# Patient Record
Sex: Male | Born: 1945 | Race: White | Hispanic: No | Marital: Married | Smoking: Former smoker
Health system: Southern US, Community
[De-identification: ages and names within clinical notes are randomized; demographics above are authoritative.]

## PROBLEM LIST (undated history)

## (undated) DIAGNOSIS — N189 Chronic kidney disease, unspecified: Secondary | ICD-10-CM

## (undated) DIAGNOSIS — M1990 Unspecified osteoarthritis, unspecified site: Secondary | ICD-10-CM

## (undated) DIAGNOSIS — C221 Intrahepatic bile duct carcinoma: Secondary | ICD-10-CM

## (undated) DIAGNOSIS — N200 Calculus of kidney: Secondary | ICD-10-CM

## (undated) DIAGNOSIS — C3490 Malignant neoplasm of unspecified part of unspecified bronchus or lung: Secondary | ICD-10-CM

## (undated) DIAGNOSIS — I10 Essential (primary) hypertension: Secondary | ICD-10-CM

## (undated) HISTORY — DX: Malignant neoplasm of unspecified part of unspecified bronchus or lung: C34.90

## (undated) HISTORY — DX: Intrahepatic bile duct carcinoma: C22.1

## (undated) HISTORY — PX: OTHER SURGICAL HISTORY: SHX169

## (undated) HISTORY — PX: CHOLECYSTECTOMY: SHX55

## (undated) HISTORY — PX: CYSTECTOMY: SUR359

## (undated) HISTORY — PX: TONSILLECTOMY: SUR1361

---

## 2008-07-09 ENCOUNTER — Encounter (INDEPENDENT_AMBULATORY_CARE_PROVIDER_SITE_OTHER): Payer: Self-pay | Admitting: General Surgery

## 2008-07-09 ENCOUNTER — Ambulatory Visit (HOSPITAL_COMMUNITY): Admission: RE | Admit: 2008-07-09 | Discharge: 2008-07-09 | Payer: Self-pay | Admitting: General Surgery

## 2010-07-31 LAB — BASIC METABOLIC PANEL
Chloride: 105 mEq/L (ref 96–112)
Creatinine, Ser: 0.94 mg/dL (ref 0.4–1.5)
GFR calc Af Amer: >60 mL/min (ref >60)
Sodium: 138 mEq/L (ref 135–145)

## 2010-07-31 LAB — CBC
Hemoglobin: 16.1 g/dL (ref 13.0–17.0)
MCV: 89.5 fL (ref 78.0–100.0)
RBC: 5.14 MIL/uL (ref 4.22–5.81)
WBC: 7.2 10*3/uL (ref 4.0–10.5)

## 2010-07-31 LAB — GLUCOSE, CAPILLARY: Glucose-Capillary: 183 mg/dL — ABNORMAL HIGH (ref 70–99)

## 2010-09-02 NOTE — H&P (Signed)
Bryce Daugherty, Bryce Daugherty                 ACCOUNT NO.:  [id number]   MEDICAL RECORD NO.:  [id number]          PATIENT TYPE:  AMB   LOCATION:  DAY                           FACILITY:  [facility]   PHYSICIAN:  Dalia Heading, M.D.  DATE OF BIRTH:  [birth year]   DATE OF ADMISSION:  DATE OF DISCHARGE:  LH                              HISTORY & PHYSICAL   CHIEF COMPLAINT:  Sebaceous cyst, left axilla.   HISTORY OF PRESENT ILLNESS:  The patient is a 65 year old white male who  is referred for evaluation and treatment of a left axillary sebaceous  cyst.  It has been present for sometime, but recently was increased in  size and is tender to touch.   PAST MEDICAL HISTORY:  Hypertension.   PAST SURGICAL HISTORY:  Cholecystectomy.   CURRENT MEDICATIONS:  1. Atenolol 25 mg p.o. daily.  2. Doxazosin 4 mg p.o. daily.   ALLERGIES:  No known drug allergies.   REVIEW OF SYSTEMS:  Noncontributory.   PHYSICAL EXAMINATION:  GENERAL:  The patient is well-developed, well-  nourished white male in no acute distress.  LUNGS:  Clear to auscultation with equal breath sounds bilaterally.  HEART:  Regular rate and rhythm without S3, S4, or murmurs.  The left  axilla has a 3-cm sebaceous cyst with sebum starting to be expressed.  It is slightly erythematous along the lateral aspect.   IMPRESSION:  Sebaceous cyst, left axilla.   PLAN:  The patient is scheduled for excision of sebaceous cyst, left  axilla on July 09, 2008.  The risks and benefits of the procedure  including bleeding, infection, and recurrence of the cyst were fully  explained to the patient, gave informed consent.      Dalia Heading, M.D.  Electronically Signed     MAJ/MEDQ  D:  07/05/2008  T:  07/06/2008  Job:  [id number]   cc:   Ramon Dredge L. Juanetta Gosling, M.D.  Fax: [phone]

## 2010-09-02 NOTE — Op Note (Signed)
NAMEWESTEN, DININO                 ACCOUNT NO.:  [id number]   MEDICAL RECORD NO.:  [id number]          PATIENT TYPE:  AMB   LOCATION:  DAY                           FACILITY:  [facility]   PHYSICIAN:  Dalia Heading, M.D.  DATE OF BIRTH:  05/04/1945   DATE OF PROCEDURE:  DATE OF DISCHARGE:                               OPERATIVE REPORT   DATE OF PROCEDURE:  July 09, 2008.   PREOPERATIVE DIAGNOSIS:  Sebaceous cyst, left axilla, 3 cm in size.   POSTOPERATIVE DIAGNOSIS:  Sebaceous cyst, left axilla, 3 cm in size.   PROCEDURE:  Excision of sebaceous cyst, left axilla.   SURGEON:  Dalia Heading, M.D.   ANESTHESIA:  General.   INDICATIONS:  The patient is a 66 year old white male presents with an  enlarging and tender sebaceous cyst in his left axilla.  The risks and  benefits of the procedure including bleeding, infection, recurrence of  the cyst were fully explained to the patient, I gave informed consent.   PROCEDURE NOTE:  The patient was placed in supine position.  After  general anesthesia was administered, the left axilla was prepped and  draped using the usual sterile technique with DuraPrep.  Surgical site  confirmation was performed.   An incision was made over the sebaceous cyst.  The sebaceous cyst was  excised without difficulty.  It was sent to Pathology for further  examination.  Bleeding was controlled using Bovie electrocautery.  The  incision site was instilled with 0.57 Sensorcaine.  The skin was closed  using a 4-0 Vicryl subcuticular suture.  Dermabond was then applied.   All tape and needle counts correct at the end of the procedure.  The  patient was awakened and transferred to PACU in stable condition.   COMPLICATIONSS:  None.   SPECIMEN:  Sebaceous cyst, left axilla.   BLOOD LOSS:  Minimal.      Dalia Heading, M.D.  Electronically Signed     MAJ/MEDQ  D:  07/09/2008  T:  07/09/2008  Job:  [id number]   cc:   Ramon Dredge L. Juanetta Gosling, M.D.  Fax:  [phone]

## 2011-02-25 ENCOUNTER — Telehealth: Payer: Self-pay

## 2011-02-25 ENCOUNTER — Other Ambulatory Visit: Payer: Self-pay

## 2011-02-25 DIAGNOSIS — Z139 Encounter for screening, unspecified: Secondary | ICD-10-CM

## 2011-02-25 NOTE — Telephone Encounter (Signed)
MOVIPREP SPLIT DOSING HOLD GLIPIZIDE THE AM OF HIS PROCEDURE. TAKE 1/2 HIS TENORMIN THE AM OF HIS PROCEDURE.

## 2011-02-25 NOTE — Telephone Encounter (Signed)
Gastroenterology Pre-Procedure Form    Request Date: 02/25/2011      Requesting Physician: Dr. Juanetta Gosling     PATIENT INFORMATION:  Bryce Daugherty is a 65 y.o., male (DOB=1945/10/14).  PROCEDURE: Procedure(s) requested: colonoscopy Procedure Reason: screening for colon cancer  PATIENT REVIEW QUESTIONS: The patient reports the following:   1. Diabetes Melitis: yes  2. Joint replacements in the past 12 months: no 3. Major health problems in the past 3 months: no 4. Has an artificial valve or MVP:no 5. Has been advised in past to take antibiotics in advance of a procedure like teeth cleaning: no}    MEDICATIONS & ALLERGIES:    Patient reports the following regarding taking any blood thinners:   Plavix? no Aspirin?yes  Coumadin?  no  Patient confirms/reports the following medications:  Current Outpatient Prescriptions  Medication Sig Dispense Refill  . aspirin 81 MG tablet Take 81 mg by mouth daily.        Marland Kitchen atenolol (TENORMIN) 25 MG tablet Take 25 mg by mouth daily.        Marland Kitchen doxazosin (CARDURA) 8 MG tablet Take 8 mg by mouth at bedtime. Takes one half tablet daily       . glipiZIDE (GLUCOTROL) 5 MG tablet Take 5 mg by mouth 1 day or 1 dose. Pt takes in the AM       . metformin (FORTAMET) 500 MG (OSM) 24 hr tablet Take 500 mg by mouth daily with breakfast.          Patient confirms/reports the following allergies:  No Known Allergies  Patient is appropriate to schedule for requested procedure(s): yes  AUTHORIZATION INFORMATION Primary Insurance:   ID #:   Group #:  Pre-Cert / Auth required:  Pre-Cert / Auth #:   Secondary Insurance:   ID #:  Group #:  Pre-Cert / Auth required:  Pre-Cert / Auth #:   No orders of the defined types were placed in this encounter.    SCHEDULE INFORMATION: Procedure has been scheduled as follows:  Date: 03/24/2011     Time: 8:30 Am  Location: [facility] Short Stay  This Gastroenterology Pre-Precedure Form is being routed to the  following provider(s) for review: Jonette Eva, MD

## 2011-02-26 NOTE — Telephone Encounter (Signed)
Rx and instructions mailed.  

## 2011-03-17 ENCOUNTER — Encounter (HOSPITAL_COMMUNITY): Payer: Self-pay | Admitting: Pharmacy Technician

## 2011-03-23 MED ORDER — SODIUM CHLORIDE 0.45 % IV SOLN
Freq: Once | INTRAVENOUS | Status: AC
  Administered 2011-03-24: 08:00:00 via INTRAVENOUS

## 2011-03-24 ENCOUNTER — Ambulatory Visit (HOSPITAL_COMMUNITY)
Admission: RE | Admit: 2011-03-24 | Discharge: 2011-03-24 | Disposition: A | Discharge status: Home / Self Care | Payer: 59 | Source: Ambulatory Visit | Attending: Gastroenterology | Admitting: Gastroenterology

## 2011-03-24 ENCOUNTER — Encounter (HOSPITAL_COMMUNITY)
Admission: RE | Disposition: A | Discharge status: Home / Self Care | Payer: Self-pay | Source: Ambulatory Visit | Attending: Gastroenterology

## 2011-03-24 ENCOUNTER — Encounter (HOSPITAL_COMMUNITY): Payer: Self-pay | Admitting: *Deleted

## 2011-03-24 DIAGNOSIS — Z79899 Other long term (current) drug therapy: Secondary | ICD-10-CM | POA: Insufficient documentation

## 2011-03-24 DIAGNOSIS — Z1211 Encounter for screening for malignant neoplasm of colon: Secondary | ICD-10-CM

## 2011-03-24 DIAGNOSIS — I10 Essential (primary) hypertension: Secondary | ICD-10-CM | POA: Insufficient documentation

## 2011-03-24 DIAGNOSIS — Z139 Encounter for screening, unspecified: Secondary | ICD-10-CM

## 2011-03-24 DIAGNOSIS — Z01812 Encounter for preprocedural laboratory examination: Secondary | ICD-10-CM | POA: Insufficient documentation

## 2011-03-24 DIAGNOSIS — K648 Other hemorrhoids: Secondary | ICD-10-CM | POA: Insufficient documentation

## 2011-03-24 DIAGNOSIS — Z7982 Long term (current) use of aspirin: Secondary | ICD-10-CM | POA: Insufficient documentation

## 2011-03-24 DIAGNOSIS — E119 Type 2 diabetes mellitus without complications: Secondary | ICD-10-CM | POA: Insufficient documentation

## 2011-03-24 HISTORY — DX: Essential (primary) hypertension: I10

## 2011-03-24 HISTORY — PX: COLONOSCOPY: SHX5424

## 2011-03-24 LAB — GLUCOSE, CAPILLARY: Glucose-Capillary: 141 mg/dL — ABNORMAL HIGH (ref 70–99)

## 2011-03-24 SURGERY — COLONOSCOPY
Anesthesia: Moderate Sedation

## 2011-03-24 MED ORDER — STERILE WATER FOR IRRIGATION IR SOLN
Status: DC | PRN
  Administered 2011-03-24: 09:00:00

## 2011-03-24 MED ORDER — MIDAZOLAM HCL 5 MG/5ML IJ SOLN
INTRAMUSCULAR | Status: AC
  Filled 2011-03-24: qty 10

## 2011-03-24 MED ORDER — MIDAZOLAM HCL 5 MG/5ML IJ SOLN
INTRAMUSCULAR | Status: DC | PRN
  Administered 2011-03-24: 1 mg via INTRAVENOUS
  Administered 2011-03-24: 2 mg via INTRAVENOUS

## 2011-03-24 MED ORDER — MEPERIDINE HCL 100 MG/ML IJ SOLN
INTRAMUSCULAR | Status: AC
  Filled 2011-03-24: qty 2

## 2011-03-24 MED ORDER — MEPERIDINE HCL 100 MG/ML IJ SOLN
INTRAMUSCULAR | Status: DC | PRN
  Administered 2011-03-24: 25 mg via INTRAVENOUS
  Administered 2011-03-24: 50 mg via INTRAVENOUS

## 2011-03-24 NOTE — H&P (Signed)
  Primary Care Physician:  Fredirick Maudlin, MD Primary Gastroenterologist:  Dr. Darrick Penna  Pre-Procedure History & Physical: HPI:  Bryce Daugherty is a 65 y.o. male here for AVERAGE RISK SCREENING.  Past Medical History  Diagnosis Date  . Hypertension   . Diabetes mellitus     Past Surgical History  Procedure Date  . Cystectomy under left arm    Prior to Admission medications   Medication Sig Start Date End Date Taking? Authorizing Provider  aspirin EC 81 MG tablet Take 81 mg by mouth daily.     Yes Historical Provider, MD  atenolol (TENORMIN) 25 MG tablet Take 25 mg by mouth daily.     Yes Historical Provider, MD  doxazosin (CARDURA) 8 MG tablet Take 8 mg by mouth at bedtime. Takes one half tablet daily    Yes Historical Provider, MD  glipiZIDE (GLUCOTROL) 5 MG tablet Take 5 mg by mouth every morning. Pt takes in the AM   Yes Historical Provider, MD  metformin (FORTAMET) 500 MG (OSM) 24 hr tablet Take 500 mg by mouth daily with breakfast.    Yes Historical Provider, MD  Saw Palmetto, Serenoa repens, 450 MG CAPS Take 1 capsule by mouth daily.     Yes Historical Provider, MD    Allergies as of 02/25/2011  . (No Known Allergies)    History reviewed. No pertinent family history.  History   Social History  . Marital Status: Married    Spouse Name: N/A    Number of Children: N/A  . Years of Education: N/A   Occupational History  . Not on file.   Social History Main Topics  . Smoking status: Former Games developer  . Smokeless tobacco: Not on file  . Alcohol Use: No  . Drug Use: No  . Sexually Active:    Other Topics Concern  . Not on file   Social History Narrative  . No narrative on file    Review of Systems: See HPI, otherwise negative ROS   Physical Exam: BP 134/84  Pulse 54  Temp(Src) 97.8 F (36.6 C) (Oral)  Resp 14  SpO2 96% General:   Alert,  pleasant and cooperative in NAD Head:  Normocephalic and atraumatic. Neck:  Supple; no masses or  thyromegaly. Lungs:  Clear throughout to auscultation.    Heart:  Regular rate and rhythm. Abdomen:  Soft, nontender and nondistended. Normal bowel sounds, without guarding, and without rebound.   Neurologic:  Alert and  oriented x4;  grossly normal neurologically.  Impression/Plan:    AVERAGE RISK  PLAN: TCS TODAY

## 2011-04-02 ENCOUNTER — Encounter (HOSPITAL_COMMUNITY): Payer: Self-pay | Admitting: Gastroenterology

## 2011-04-20 ENCOUNTER — Other Ambulatory Visit (HOSPITAL_COMMUNITY): Payer: Self-pay | Admitting: Pulmonary Disease

## 2011-04-20 ENCOUNTER — Ambulatory Visit (HOSPITAL_COMMUNITY)
Admission: RE | Admit: 2011-04-20 | Discharge: 2011-04-20 | Disposition: A | Discharge status: Home / Self Care | Payer: 59 | Source: Ambulatory Visit | Attending: Pulmonary Disease | Admitting: Pulmonary Disease

## 2011-04-20 DIAGNOSIS — K7689 Other specified diseases of liver: Secondary | ICD-10-CM | POA: Insufficient documentation

## 2011-04-20 DIAGNOSIS — N201 Calculus of ureter: Secondary | ICD-10-CM | POA: Insufficient documentation

## 2011-04-20 DIAGNOSIS — N200 Calculus of kidney: Secondary | ICD-10-CM | POA: Insufficient documentation

## 2011-04-20 DIAGNOSIS — R1032 Left lower quadrant pain: Secondary | ICD-10-CM

## 2011-04-20 LAB — CREATININE, SERUM
Creatinine, Ser: 1.4 mg/dL — ABNORMAL HIGH (ref 0.50–1.35)
GFR calc non Af Amer: 51 mL/min — ABNORMAL LOW (ref >90)

## 2011-04-20 MED ORDER — IOHEXOL 300 MG/ML  SOLN
100.0000 mL | Freq: Once | INTRAMUSCULAR | Status: AC | PRN
  Administered 2011-04-20: 100 mL via INTRAVENOUS

## 2011-04-24 ENCOUNTER — Other Ambulatory Visit: Payer: Self-pay | Admitting: Urology

## 2011-04-24 ENCOUNTER — Ambulatory Visit (HOSPITAL_COMMUNITY)
Admission: RE | Admit: 2011-04-24 | Discharge: 2011-04-24 | Disposition: A | Discharge status: Home / Self Care | Payer: 59 | Source: Ambulatory Visit | Attending: Urology | Admitting: Urology

## 2011-04-24 ENCOUNTER — Ambulatory Visit (INDEPENDENT_AMBULATORY_CARE_PROVIDER_SITE_OTHER): Payer: 59 | Admitting: Urology

## 2011-04-24 ENCOUNTER — Encounter (HOSPITAL_COMMUNITY): Payer: Self-pay | Admitting: *Deleted

## 2011-04-24 DIAGNOSIS — N201 Calculus of ureter: Secondary | ICD-10-CM

## 2011-04-24 DIAGNOSIS — N200 Calculus of kidney: Secondary | ICD-10-CM

## 2011-04-24 DIAGNOSIS — R109 Unspecified abdominal pain: Secondary | ICD-10-CM | POA: Insufficient documentation

## 2011-04-24 NOTE — Progress Notes (Signed)
Instructed to hold all aspirin, ibuprofen, vitamins, herbal medication till after ESWL.Review list in blue folder of all restricted meds. Laxative as directed by doctor day prior. Hold diabetes medication day of procedure. Clear liquids from midnight till 11am . Plenty of liquids due to diabetes.

## 2011-04-24 NOTE — Progress Notes (Signed)
1815 spoke to patient  Informed to arrive at 1400 (not 1500) and to be npo after 1000 (instead of 1100 as previously instructed by nurse)  Pt verbalizes understanding of this,

## 2011-04-27 ENCOUNTER — Encounter (HOSPITAL_COMMUNITY)
Admission: RE | Disposition: A | Discharge status: Home / Self Care | Payer: Self-pay | Source: Ambulatory Visit | Attending: Urology

## 2011-04-27 ENCOUNTER — Ambulatory Visit (HOSPITAL_COMMUNITY): Payer: 59

## 2011-04-27 ENCOUNTER — Ambulatory Visit (HOSPITAL_COMMUNITY)
Admission: RE | Admit: 2011-04-27 | Discharge: 2011-04-27 | Disposition: A | Discharge status: Home / Self Care | Payer: 59 | Source: Ambulatory Visit | Attending: Urology | Admitting: Urology

## 2011-04-27 ENCOUNTER — Encounter (HOSPITAL_COMMUNITY): Payer: Self-pay | Admitting: *Deleted

## 2011-04-27 DIAGNOSIS — N201 Calculus of ureter: Secondary | ICD-10-CM | POA: Diagnosis present

## 2011-04-27 DIAGNOSIS — E119 Type 2 diabetes mellitus without complications: Secondary | ICD-10-CM | POA: Insufficient documentation

## 2011-04-27 DIAGNOSIS — I10 Essential (primary) hypertension: Secondary | ICD-10-CM | POA: Insufficient documentation

## 2011-04-27 HISTORY — DX: Chronic kidney disease, unspecified: N18.9

## 2011-04-27 HISTORY — DX: Calculus of kidney: N20.0

## 2011-04-27 HISTORY — DX: Unspecified osteoarthritis, unspecified site: M19.90

## 2011-04-27 SURGERY — LITHOTRIPSY, ESWL
Anesthesia: LOCAL | Site: Pelvis | Laterality: Left

## 2011-04-27 MED ORDER — DIPHENHYDRAMINE HCL 25 MG PO CAPS
25.0000 mg | ORAL_CAPSULE | ORAL | Status: AC
  Administered 2011-04-27: 25 mg via ORAL

## 2011-04-27 MED ORDER — DIPHENHYDRAMINE HCL 25 MG PO CAPS
ORAL_CAPSULE | ORAL | Status: AC
  Administered 2011-04-27: 25 mg via ORAL
  Filled 2011-04-27: qty 1

## 2011-04-27 MED ORDER — DIAZEPAM 5 MG PO TABS
10.0000 mg | ORAL_TABLET | ORAL | Status: AC
  Administered 2011-04-27: 10 mg via ORAL

## 2011-04-27 MED ORDER — CIPROFLOXACIN HCL 500 MG PO TABS
500.0000 mg | ORAL_TABLET | ORAL | Status: AC
  Administered 2011-04-27: 500 mg via ORAL

## 2011-04-27 MED ORDER — DIAZEPAM 5 MG PO TABS
ORAL_TABLET | ORAL | Status: AC
  Administered 2011-04-27: 10 mg via ORAL
  Filled 2011-04-27: qty 2

## 2011-04-27 MED ORDER — DEXTROSE-NACL 5-0.45 % IV SOLN
INTRAVENOUS | Status: DC
  Administered 2011-04-27: 16:00:00 via INTRAVENOUS

## 2011-04-27 MED ORDER — CIPROFLOXACIN HCL 500 MG PO TABS
ORAL_TABLET | ORAL | Status: AC
  Administered 2011-04-27: 500 mg via ORAL
  Filled 2011-04-27: qty 1

## 2011-04-27 NOTE — H&P (Signed)
1. 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 Nephrolithiasis Bilateral 592.0 3. Ureteral Stone Left 592.1 4. Ureteral Stone Right 592.1  History of Present Illness  Bryce Daugherty is a 65 yo WM sent by Dr. Shaune Pollack for a left proximal ureteral stone.  He had the onset in October of right flank pain but the pain eased.  He had a negative colonoscopy in December.  The Sunday before Christmas he had mod/severe left flank pain with nausea with vomiting x 1.  He saw Dr. Juanetta Gosling and was treated with muscle relaxer and steroids.  He returned on Monday and was sent for a CT and was found to have a 9mm left proximal stone.  He was also found to have bilateral renal stones.  He was given pain meds and his pain abated but then returned.  Today he has intermittant pain.  He has no further nausea.  He has no gross hematuria.  He has some frequency with increased fluids but no urgency.  He has had no more right sided pain and had no right ureteral stones on CT.  He has not had prior stones or other GU history.   Past Medical History Problems  1. History of  Arthritis V13.4 2. History of  Cataract 366.9 3. History of  Diabetes Mellitus 250.00 4. History of  Hypertension 401.9  Surgical History Problems  1. History of  Cholecystectomy 2. History of  Tonsillectomy  Current Meds 1. Atenolol 25 MG Oral Tablet; Therapy: (Recorded:04Jan2013) to 2. Doxazosin Mesylate 4 MG Oral Tablet; Therapy: (Recorded:04Jan2013) to 3. GlipiZIDE 5 MG Oral Tablet; Therapy: (Recorded:04Jan2013) to 4. MetFORMIN HCl 500 MG Oral Tablet; Therapy: (Recorded:04Jan2013) to  Allergies Medication  1. No Known Drug Allergies  Family History Problems  1. Family history of  Acute Myocardial Infarction V17.3 2. Family history of  Carcinoma Of The Pancreas 3. Family history of  Death In The Family Father 4. Family history of  Death In The Family Mother 5. Family history of  Family Health Status Number Of Children  Social History Problems     Caffeine Use   Former Smoker V15.82   Marital History - Currently Married   Occupation: Denied    History of  Alcohol Use  Review of Systems Genitourinary, constitutional, skin, eye, otolaryngeal, hematologic/lymphatic, cardiovascular, pulmonary, endocrine, musculoskeletal, gastrointestinal, neurological and psychiatric system(s) were reviewed and pertinent findings if present are noted.  Genitourinary: urinary frequency and nocturia.  Gastrointestinal: flank pain and abdominal pain.  Constitutional: recent weight loss.  Musculoskeletal: back pain.    Vitals Vital Signs [Data Includes: Last 1 Day]  04Jan2013 10:20AM  BMI Calculated: 31.52 BSA Calculated: 2.08 Height: 5 ft 8 in Weight: 208 lb  Blood Pressure: 154 / 92 Temperature: 98.3 F Heart Rate: 102  Physical Exam Constitutional: Well nourished and well developed . No acute distress.  ENT:. The ears and nose are normal in appearance.  Neck: The appearance of the neck is normal and no neck mass is present.  Pulmonary: No respiratory distress and normal respiratory rhythm and effort.  Cardiovascular: Heart rate and rhythm are normal . No peripheral edema.  Abdomen: The abdomen is soft and nontender. No masses are palpated. mild left CVA tenderness. No hernias are palpable. No hepatosplenomegaly noted.  Lymphatics: The supraclavicular and axillary nodes are not enlarged or tender.  Skin: Normal skin turgor, no visible rash and no visible skin lesions.  Neuro/Psych:. Mood and affect are appropriate.    Results/Data Urine [Data Includes: Last 1 Day]  04Jan2013  COLOR YELLOW   APPEARANCE CLOUDY   SPECIFIC GRAVITY 1.025   pH 6.0   GLUCOSE NEG mg/dL  BILIRUBIN NEG   KETONE NEG mg/dL  BLOOD LARGE   PROTEIN TRACE mg/dL  UROBILINOGEN 0.2 mg/dL  NITRITE NEG   LEUKOCYTE ESTERASE NEG   SQUAMOUS EPITHELIAL/HPF RARE   WBC 0-3 WBC/hpf  RBC TNTC RBC/hpf  BACTERIA RARE   CRYSTALS NONE SEEN   CASTS NONE SEEN    Old  records or history reviewed: I have reviewed recent records from Dr. Juanetta Gosling.  The following images/tracing/specimen were independently visualized:  I have reviewed his CT films and report. He has bilateral renal stones and a 9mm left proximal stone. He has a 7cm liver lesion that is probably a hemangioma but an MRI was recommended. KUB today shows no change in the location of the 8x27mm left proximal stone. He has small left renal stones. His RLP stone is now at the RUPJ and measures 11x21mm. He has small right renal stones. See report for additional details. I did a limited bilateral renal US in the office today and he has no hydronephrosis.    Assessment Assessed  1. Ureteral Stone Left 592.1 2. Nephrolithiasis Bilateral 592.0 3. Ureteral Stone Right 592.1 4. 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   Fortunately, he has no obstruction at this time on either side. He has a 7cm liver lesion for which an MRI was suggested.   Plan Nephrolithiasis (592.0)  1. UA With REFLEX  Done: 04Jan2013 10:33AM Ureteral Stone (592.1)  2. Oxycodone-Acetaminophen 5-325 MG Oral Tablet; take 1 or 2 tablets q 4-6 hours prn pain;  Therapy: 04Jan2013 to (Last Rx:04Jan2013) 3. KUB  Requested for: 04Jan2013   I discussed the options for treatment including ureteroscopy and ESWL.   Since he is not obstructed and his pain is intermittant, I believe sequential ESWL is appropriate and will set him up for a left ESWL next week followed by a right ESWL 2 weeks later.  I have reviewed the risks of bleeding, infection, injury to adjacent organs which can possibly catastrophic and life threatening, poor fragmentation or obstructing fragments requiring additional procedures, thrombotic events, cardiac arrhythmias and  sedation risks. He will need an MRI of the abdomen for further evaluation of the liver lesion, but I will defer that to Dr. Juanetta Gosling. He will need evaluation for metabolic stone disease because of the extent of disease.   He was going send some labs that he had earlier this year.

## 2011-04-27 NOTE — Interval H&P Note (Signed)
History and Physical Interval Note:  04/27/2011 7:59 AM  Bryce Daugherty  has presented today for surgery, with the diagnosis of left ureteral stone  The various methods of treatment have been discussed with the patient and family. After consideration of risks, benefits and other options for treatment, the patient has consented to  Procedure(s): EXTRACORPOREAL SHOCK WAVE LITHOTRIPSY (ESWL) as a surgical intervention .  The patients' history has been reviewed, patient examined, no change in status, stable for surgery.  I have reviewed the patients' chart and labs.  Questions were answered to the patient's satisfaction.     [name]

## 2011-04-27 NOTE — Op Note (Signed)
S/P left ESWL.   See scanned [facility] op note.

## 2011-04-29 ENCOUNTER — Encounter (HOSPITAL_COMMUNITY): Payer: Self-pay

## 2011-04-30 ENCOUNTER — Encounter (HOSPITAL_COMMUNITY): Payer: Self-pay | Admitting: *Deleted

## 2011-04-30 NOTE — Progress Notes (Signed)
Patient given telephone instructions for ESWL. To hold aspirin 81 mg (has not taken ASA for one month). No aspirin products or NSAIDS for 72 hours before procedure. To bring blue folder from [facility]. NPO after midnight the night before procedure. To take atenolol and doxazosin the AM of procedure with sip of water. To hold glipizide and metformin. Pt verbalizes understanding.

## 2011-05-04 ENCOUNTER — Other Ambulatory Visit: Payer: Self-pay | Admitting: Urology

## 2011-05-04 ENCOUNTER — Ambulatory Visit (HOSPITAL_COMMUNITY)
Admission: RE | Admit: 2011-05-04 | Discharge: 2011-05-04 | Disposition: A | Discharge status: Home / Self Care | Payer: 59 | Source: Ambulatory Visit | Attending: Urology | Admitting: Urology

## 2011-05-04 DIAGNOSIS — N201 Calculus of ureter: Secondary | ICD-10-CM | POA: Insufficient documentation

## 2011-05-04 DIAGNOSIS — N200 Calculus of kidney: Secondary | ICD-10-CM | POA: Insufficient documentation

## 2011-05-04 DIAGNOSIS — R109 Unspecified abdominal pain: Secondary | ICD-10-CM | POA: Insufficient documentation

## 2011-05-06 NOTE — H&P (View-Only) (Signed)
 1. 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 Nephrolithiasis Bilateral 592.0 3. Ureteral Stone Left 592.1 4. Ureteral Stone Right 592.1  History of Present Illness  Bryce Daugherty is a 66 yo WM sent by Dr. Ed Hawkins for a left proximal ureteral stone.  He had the onset in October of right flank pain but the pain eased.  He had a negative colonoscopy in December.  The Sunday before Christmas he had mod/severe left flank pain with nausea with vomiting x 1.  He saw Dr. Hawkins and was treated with muscle relaxer and steroids.  He returned on Monday and was sent for a CT and was found to have a 9mm left proximal stone.  He was also found to have bilateral renal stones.  He was given pain meds and his pain abated but then returned.  Today he has intermittant pain.  He has no further nausea.  He has no gross hematuria.  He has some frequency with increased fluids but no urgency.  He has had no more right sided pain and had no right ureteral stones on CT.  He has not had prior stones or other GU history.   Past Medical History Problems  1. History of  Arthritis V13.4 2. History of  Cataract 366.9 3. History of  Diabetes Mellitus 250.00 4. History of  Hypertension 401.9  Surgical History Problems  1. History of  Cholecystectomy 2. History of  Tonsillectomy  Current Meds 1. Atenolol 25 MG Oral Tablet; Therapy: (Recorded:04Jan2013) to 2. Doxazosin Mesylate 4 MG Oral Tablet; Therapy: (Recorded:04Jan2013) to 3. GlipiZIDE 5 MG Oral Tablet; Therapy: (Recorded:04Jan2013) to 4. MetFORMIN HCl 500 MG Oral Tablet; Therapy: (Recorded:04Jan2013) to  Allergies Medication  1. No Known Drug Allergies  Family History Problems  1. Family history of  Acute Myocardial Infarction V17.3 2. Family history of  Carcinoma Of The Pancreas 3. Family history of  Death In The Family Father 4. Family history of  Death In The Family Mother 5. Family history of  Family Health Status Number Of Children  Social History Problems     Caffeine Use   Former Smoker V15.82   Marital History - Currently Married   Occupation: Denied    History of  Alcohol Use  Review of Systems Genitourinary, constitutional, skin, eye, otolaryngeal, hematologic/lymphatic, cardiovascular, pulmonary, endocrine, musculoskeletal, gastrointestinal, neurological and psychiatric system(s) were reviewed and pertinent findings if present are noted.  Genitourinary: urinary frequency and nocturia.  Gastrointestinal: flank pain and abdominal pain.  Constitutional: recent weight loss.  Musculoskeletal: back pain.    Vitals Vital Signs [Data Includes: Last 1 Day]  04Jan2013 10:20AM  BMI Calculated: 31.52 BSA Calculated: 2.08 Height: 5 ft 8 in Weight: 208 lb  Blood Pressure: 154 / 92 Temperature: 98.3 F Heart Rate: 102  Physical Exam Constitutional: Well nourished and well developed . No acute distress.  ENT:. The ears and nose are normal in appearance.  Neck: The appearance of the neck is normal and no neck mass is present.  Pulmonary: No respiratory distress and normal respiratory rhythm and effort.  Cardiovascular: Heart rate and rhythm are normal . No peripheral edema.  Abdomen: The abdomen is soft and nontender. No masses are palpated. mild left CVA tenderness. No hernias are palpable. No hepatosplenomegaly noted.  Lymphatics: The supraclavicular and axillary nodes are not enlarged or tender.  Skin: Normal skin turgor, no visible rash and no visible skin lesions.  Neuro/Psych:. Mood and affect are appropriate.    Results/Data Urine [Data Includes: Last 1 Day]     04Jan2013  COLOR YELLOW   APPEARANCE CLOUDY   SPECIFIC GRAVITY 1.025   pH 6.0   GLUCOSE NEG mg/dL  BILIRUBIN NEG   KETONE NEG mg/dL  BLOOD LARGE   PROTEIN TRACE mg/dL  UROBILINOGEN 0.2 mg/dL  NITRITE NEG   LEUKOCYTE ESTERASE NEG   SQUAMOUS EPITHELIAL/HPF RARE   WBC 0-3 WBC/hpf  RBC TNTC RBC/hpf  BACTERIA RARE   CRYSTALS NONE SEEN   CASTS NONE SEEN    Old  records or history reviewed: I have reviewed recent records from Dr. Hawkins.  The following images/tracing/specimen were independently visualized:  I have reviewed his CT films and report. He has bilateral renal stones and a 9mm left proximal stone. He has a 7cm liver lesion that is probably a hemangioma but an MRI was recommended. KUB today shows no change in the location of the 8x5mm left proximal stone. He has small left renal stones. His RLP stone is now at the RUPJ and measures 11x6mm. He has small right renal stones. See report for additional details. I did a limited bilateral renal US in the office today and he has no hydronephrosis.    Assessment Assessed  1. Ureteral Stone Left 592.1 2. Nephrolithiasis Bilateral 592.0 3. Ureteral Stone Right 592.1 4. 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   Fortunately, he has no obstruction at this time on either side. He has a 7cm liver lesion for which an MRI was suggested.   Plan Nephrolithiasis (592.0)  1. UA With REFLEX  Done: 04Jan2013 10:33AM Ureteral Stone (592.1)  2. Oxycodone-Acetaminophen 5-325 MG Oral Tablet; take 1 or 2 tablets q 4-6 hours prn pain;  Therapy: 04Jan2013 to (Last Rx:04Jan2013) 3. KUB  Requested for: 04Jan2013   I discussed the options for treatment including ureteroscopy and ESWL.   Since he is not obstructed and his pain is intermittant, I believe sequential ESWL is appropriate and will set him up for a left ESWL next week followed by a right ESWL 2 weeks later.  I have reviewed the risks of bleeding, infection, injury to adjacent organs which can possibly catastrophic and life threatening, poor fragmentation or obstructing fragments requiring additional procedures, thrombotic events, cardiac arrhythmias and  sedation risks. He will need an MRI of the abdomen for further evaluation of the liver lesion, but I will defer that to Dr. Hawkins. He will need evaluation for metabolic stone disease because of the extent of disease.   He was going send some labs that he had earlier this year.   

## 2011-05-06 NOTE — Interval H&P Note (Signed)
History and Physical Interval Note:  05/06/2011 8:58 AM  Bryce Daugherty  has presented today for surgery, with the diagnosis of right ureteral stone  The various methods of treatment have been discussed with the patient and family. After consideration of risks, benefits and other options for treatment, the patient has consented to  Procedure(s): EXTRACORPOREAL SHOCK WAVE LITHOTRIPSY (ESWL) as a surgical intervention .  The patients' history has been reviewed, patient examined, no change in status, stable for surgery.  I have reviewed the patients' chart and labs.  Questions were answered to the patient's satisfaction.     [name]  He has almost cleared the left ureteral stone and is to have treatment of the right renal stone on 1/17.

## 2011-05-07 ENCOUNTER — Ambulatory Visit (HOSPITAL_COMMUNITY): Payer: 59

## 2011-05-07 ENCOUNTER — Encounter (HOSPITAL_COMMUNITY): Payer: Self-pay | Admitting: *Deleted

## 2011-05-07 ENCOUNTER — Ambulatory Visit (HOSPITAL_COMMUNITY)
Admission: RE | Admit: 2011-05-07 | Discharge: 2011-05-07 | Disposition: A | Discharge status: Home / Self Care | Payer: 59 | Source: Ambulatory Visit | Attending: Urology | Admitting: Urology

## 2011-05-07 ENCOUNTER — Encounter (HOSPITAL_COMMUNITY)
Admission: RE | Disposition: A | Discharge status: Home / Self Care | Payer: Self-pay | Source: Ambulatory Visit | Attending: Urology

## 2011-05-07 DIAGNOSIS — I10 Essential (primary) hypertension: Secondary | ICD-10-CM | POA: Insufficient documentation

## 2011-05-07 DIAGNOSIS — N200 Calculus of kidney: Secondary | ICD-10-CM | POA: Insufficient documentation

## 2011-05-07 SURGERY — LITHOTRIPSY, ESWL
Anesthesia: LOCAL | Laterality: Right

## 2011-05-07 MED ORDER — OXYCODONE-ACETAMINOPHEN 5-325 MG PO TABS: 1.0000 | ORAL_TABLET | Freq: Four times a day (QID) | ORAL | Status: DC | PRN

## 2011-05-07 MED ORDER — CIPROFLOXACIN HCL 500 MG PO TABS
500.0000 mg | ORAL_TABLET | ORAL | Status: AC
  Administered 2011-05-07: 500 mg via ORAL

## 2011-05-07 MED ORDER — DEXTROSE-NACL 5-0.45 % IV SOLN
INTRAVENOUS | Status: DC
  Administered 2011-05-07: 11:00:00 via INTRAVENOUS

## 2011-05-07 MED ORDER — DIPHENHYDRAMINE HCL 25 MG PO CAPS
25.0000 mg | ORAL_CAPSULE | ORAL | Status: AC
  Administered 2011-05-07: 25 mg via ORAL

## 2011-05-07 MED ORDER — DIPHENHYDRAMINE HCL 25 MG PO CAPS
ORAL_CAPSULE | ORAL | Status: AC
  Filled 2011-05-07: qty 1

## 2011-05-07 MED ORDER — DIAZEPAM 5 MG PO TABS
ORAL_TABLET | ORAL | Status: AC
  Filled 2011-05-07: qty 2

## 2011-05-07 MED ORDER — DIAZEPAM 5 MG PO TABS
10.0000 mg | ORAL_TABLET | ORAL | Status: AC
  Administered 2011-05-07: 10 mg via ORAL

## 2011-05-07 MED ORDER — CIPROFLOXACIN HCL 500 MG PO TABS
ORAL_TABLET | ORAL | Status: AC
  Filled 2011-05-07: qty 1

## 2011-05-07 NOTE — Op Note (Signed)
Please see Bryce Daugherty op note scanned in the chart.

## 2011-05-07 NOTE — Progress Notes (Signed)
4782 Took laxative 05-06-11 ate light dinner,denies any Aspirin,Ibuprofen or Toradol past 72 hours

## 2011-05-07 NOTE — Interval H&P Note (Signed)
History and Physical Interval Note:  05/07/2011 11:19 AM  Bryce Daugherty  has presented today for surgery, with the diagnosis of right ureteral stone  The various methods of treatment have been discussed with the patient and family. After consideration of risks, benefits and other options for treatment, the patient has consented to  Procedure(s): EXTRACORPOREAL SHOCK WAVE LITHOTRIPSY (ESWL) as a surgical intervention .  The patients' history has been reviewed, patient examined, no change in status, stable for surgery.  I have reviewed the patients' chart and labs.  Questions were answered to the patient's satisfaction.     Irys Nigh J

## 2011-05-14 ENCOUNTER — Ambulatory Visit (HOSPITAL_COMMUNITY)
Admission: RE | Admit: 2011-05-14 | Discharge: 2011-05-14 | Disposition: A | Discharge status: Home / Self Care | Payer: 59 | Source: Ambulatory Visit | Attending: Urology | Admitting: Urology

## 2011-05-14 ENCOUNTER — Other Ambulatory Visit: Payer: Self-pay | Admitting: Urology

## 2011-05-14 DIAGNOSIS — Z09 Encounter for follow-up examination after completed treatment for conditions other than malignant neoplasm: Secondary | ICD-10-CM | POA: Insufficient documentation

## 2011-05-14 DIAGNOSIS — N201 Calculus of ureter: Secondary | ICD-10-CM

## 2011-05-14 DIAGNOSIS — N200 Calculus of kidney: Secondary | ICD-10-CM | POA: Insufficient documentation

## 2011-05-15 ENCOUNTER — Ambulatory Visit (INDEPENDENT_AMBULATORY_CARE_PROVIDER_SITE_OTHER): Payer: 59 | Admitting: Urology

## 2011-05-15 DIAGNOSIS — N201 Calculus of ureter: Secondary | ICD-10-CM

## 2011-06-05 ENCOUNTER — Other Ambulatory Visit: Payer: Self-pay | Admitting: Urology

## 2011-06-05 ENCOUNTER — Ambulatory Visit (HOSPITAL_COMMUNITY)
Admission: RE | Admit: 2011-06-05 | Discharge: 2011-06-05 | Disposition: A | Discharge status: Home / Self Care | Payer: 59 | Source: Ambulatory Visit | Attending: Urology | Admitting: Urology

## 2011-06-05 DIAGNOSIS — N200 Calculus of kidney: Secondary | ICD-10-CM

## 2011-06-05 DIAGNOSIS — Z09 Encounter for follow-up examination after completed treatment for conditions other than malignant neoplasm: Secondary | ICD-10-CM | POA: Insufficient documentation

## 2011-07-03 ENCOUNTER — Ambulatory Visit (INDEPENDENT_AMBULATORY_CARE_PROVIDER_SITE_OTHER): Payer: 59 | Admitting: Urology

## 2011-07-03 DIAGNOSIS — N201 Calculus of ureter: Secondary | ICD-10-CM

## 2011-07-03 DIAGNOSIS — N200 Calculus of kidney: Secondary | ICD-10-CM

## 2011-09-18 ENCOUNTER — Ambulatory Visit (INDEPENDENT_AMBULATORY_CARE_PROVIDER_SITE_OTHER): Payer: 59 | Admitting: Urology

## 2011-09-18 DIAGNOSIS — N200 Calculus of kidney: Secondary | ICD-10-CM

## 2012-03-11 ENCOUNTER — Ambulatory Visit: Payer: 59 | Admitting: Urology

## 2013-02-04 IMAGING — CR DG ABDOMEN 1V
1 series · 1 of 1 positions shown · non-contrast
Comparison: CT abdomen and pelvis 04/20/2011

CLINICAL DATA: Renal calculi, question position of left side kidney
stone

ABDOMEN - 1 VIEW

[view not recorded]
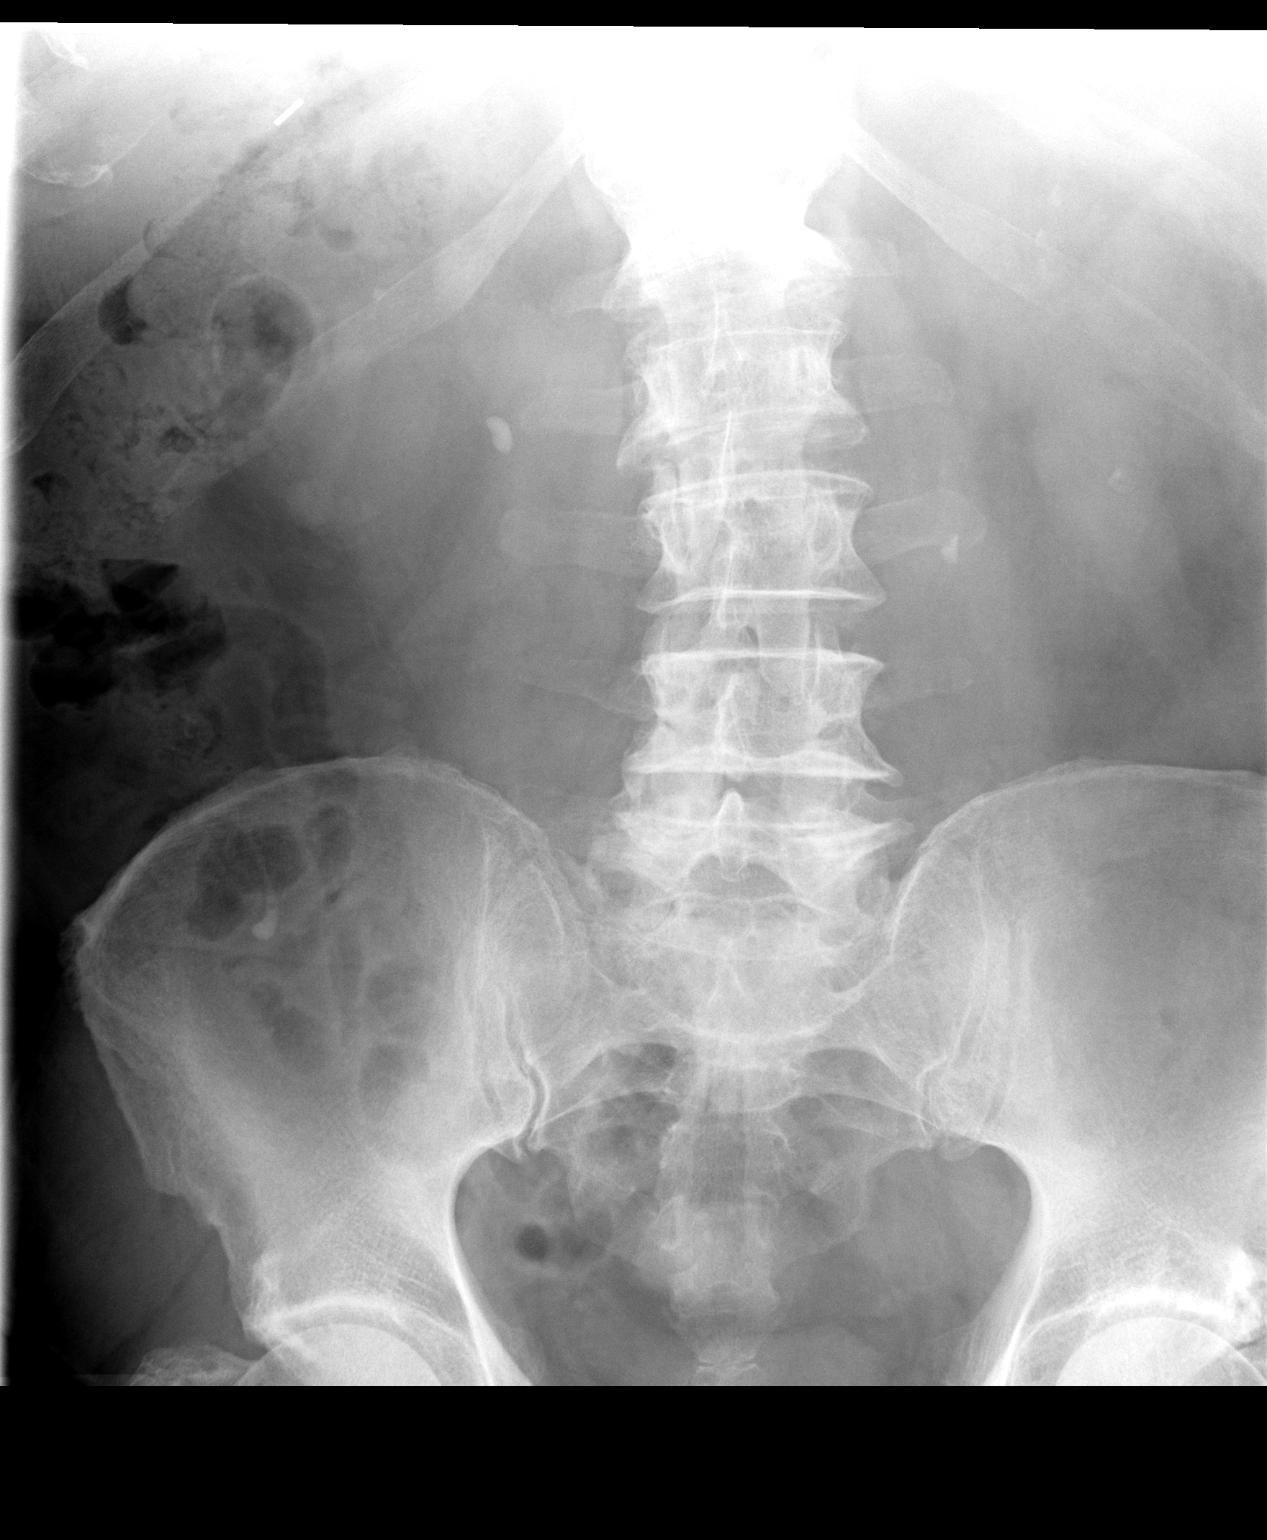

[1 of 1 positions shown; findings below may reference images not displayed]

FINDINGS: Bilateral renal calculi, largest at inferior pole left kidney 8 x 5
mm diameter.
Somewhat triangular appearing proximal left ureteral calculus, 8 x
5 mm, located at the inferior aspect of the left transverse process
of L3.
11 x 6 mm diameter calculus at right ureteropelvic junction.
No distal ureteral calcifications.
Bones unremarkable.
Bowel gas pattern normal.
IMPRESSION: Bilateral renal calculi.
Persistent visualization of an 8 x 5 mm proximal left ureteral
calculus, located at mid L3.
11 x 6 mm diameter right UPJ calculus, which was located at the
inferior pole of the right kidney on the prior CT.

## 2013-02-07 IMAGING — CR DG ABDOMEN 1V
1 series · 1 of 1 positions shown · non-contrast
Comparison: CT 04/20/2011

CLINICAL DATA: Pre lithotripsy.

ABDOMEN - 1 VIEW

[t abdomen supine]
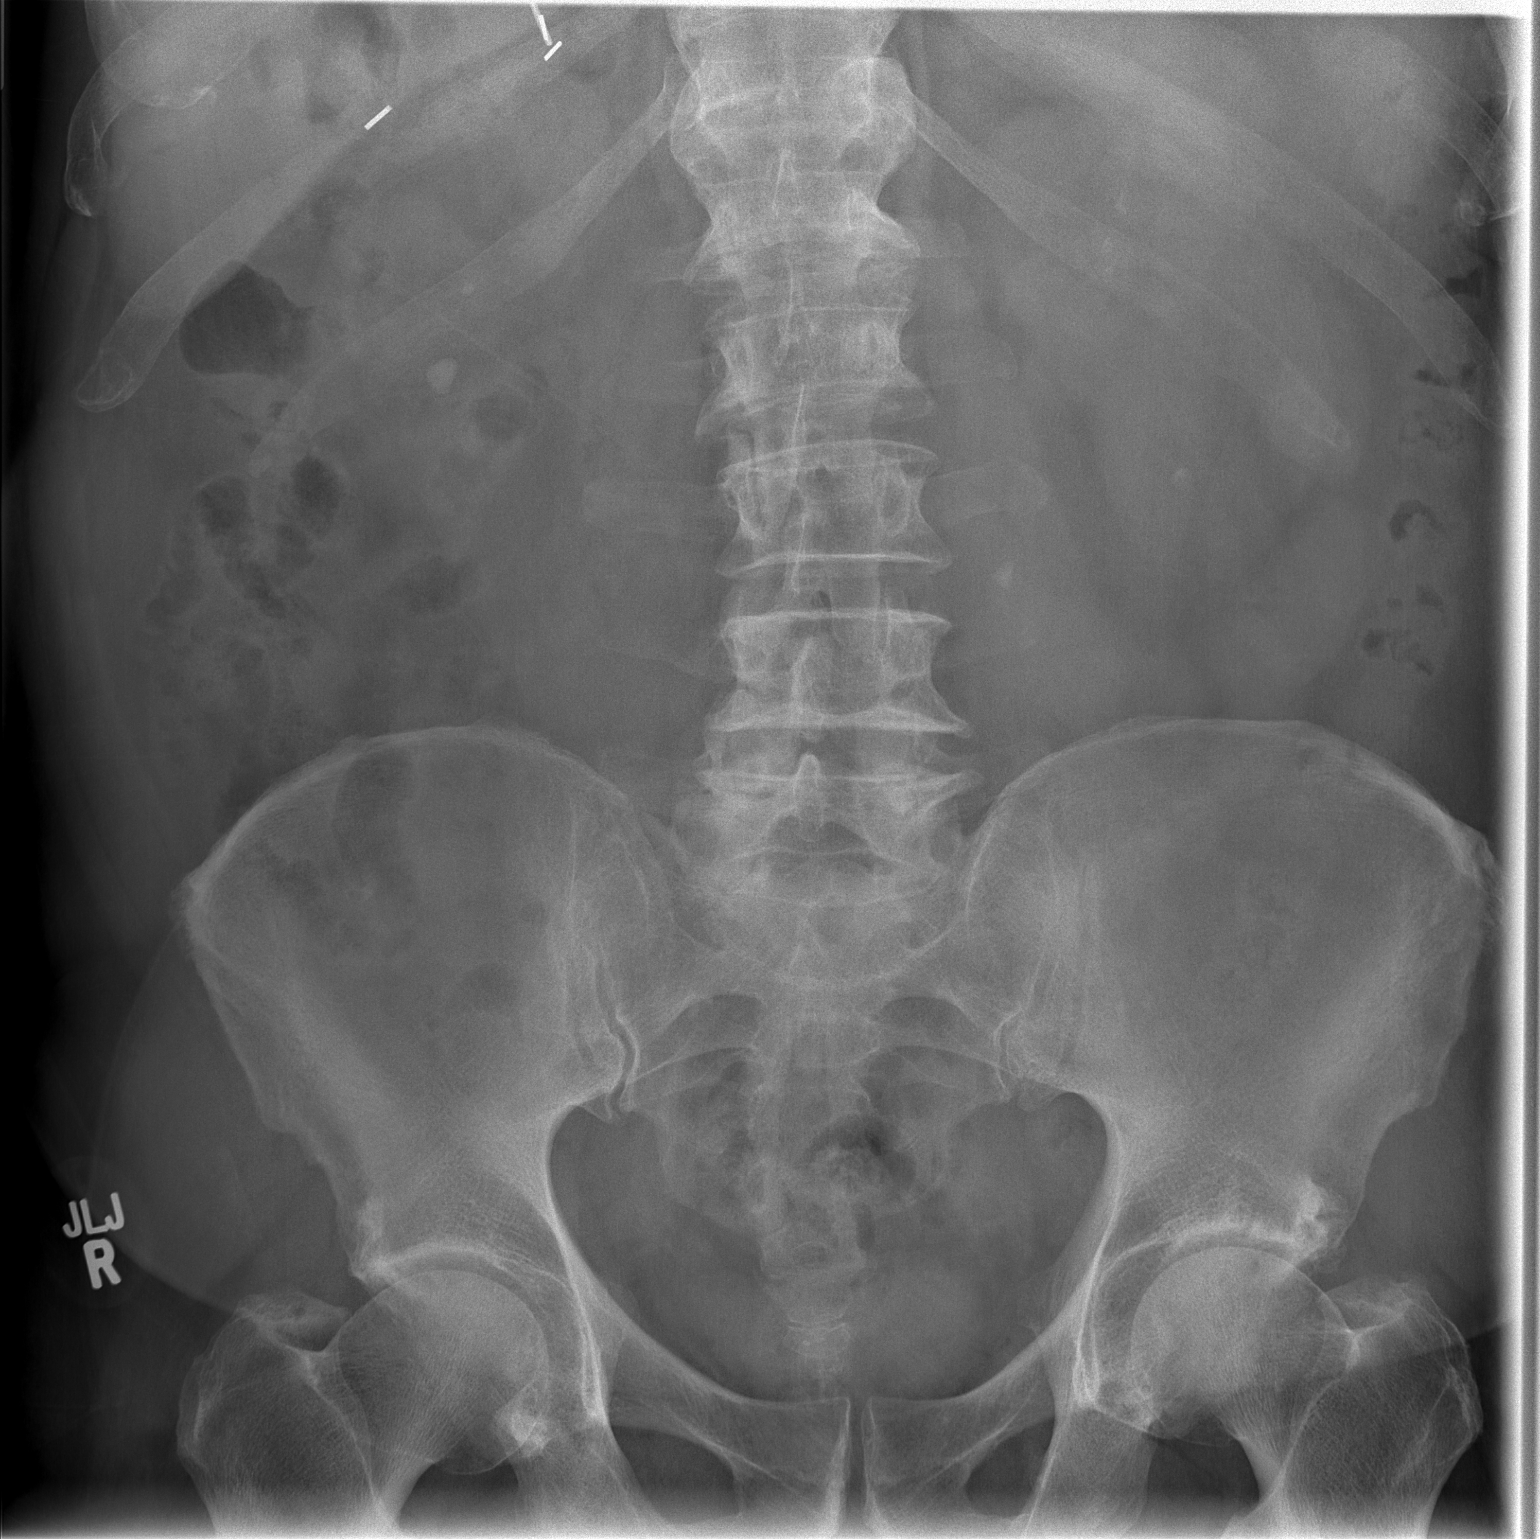

[1 of 1 positions shown; findings below may reference images not displayed]

FINDINGS: There is a triangular shaped stone in the region of the
proximal left ureter that measures up to a 9 mm. Left ureter stone
position is similar to the prior examination.  Stable 6 mm stone in
the left kidney lower pole along with at least three stones in left
kidney upper pole.  Again noted are two right kidney stones.  The
largest right stone is in the lower pole region and roughly
measures 1 cm.  No definite stones in the pelvis.  Nonspecific
bowel gas pattern.
IMPRESSION: Stable appearance of the renal stones.  There are bilateral kidney
stones and a 9 mm stone in the proximal left ureter.

## 2013-07-19 DIAGNOSIS — C221 Intrahepatic bile duct carcinoma: Secondary | ICD-10-CM

## 2013-07-19 HISTORY — DX: Intrahepatic bile duct carcinoma: C22.1

## 2013-07-26 ENCOUNTER — Other Ambulatory Visit (HOSPITAL_COMMUNITY): Payer: Self-pay | Admitting: Pulmonary Disease

## 2013-07-26 DIAGNOSIS — R17 Unspecified jaundice: Secondary | ICD-10-CM

## 2013-07-27 ENCOUNTER — Ambulatory Visit (HOSPITAL_COMMUNITY)
Admission: RE | Admit: 2013-07-27 | Discharge: 2013-07-27 | Disposition: A | Discharge status: Home / Self Care | Payer: 59 | Source: Ambulatory Visit | Attending: Pulmonary Disease | Admitting: Pulmonary Disease

## 2013-07-27 DIAGNOSIS — R634 Abnormal weight loss: Secondary | ICD-10-CM | POA: Insufficient documentation

## 2013-07-27 DIAGNOSIS — R17 Unspecified jaundice: Secondary | ICD-10-CM | POA: Insufficient documentation

## 2013-07-27 DIAGNOSIS — K838 Other specified diseases of biliary tract: Secondary | ICD-10-CM | POA: Insufficient documentation

## 2013-07-31 ENCOUNTER — Telehealth: Payer: Self-pay

## 2013-07-31 ENCOUNTER — Other Ambulatory Visit: Payer: Self-pay | Admitting: Internal Medicine

## 2013-07-31 ENCOUNTER — Encounter (INDEPENDENT_AMBULATORY_CARE_PROVIDER_SITE_OTHER): Payer: Self-pay

## 2013-07-31 ENCOUNTER — Ambulatory Visit (INDEPENDENT_AMBULATORY_CARE_PROVIDER_SITE_OTHER): Payer: 59 | Admitting: Gastroenterology

## 2013-07-31 ENCOUNTER — Encounter (HOSPITAL_COMMUNITY)
Admission: RE | Admit: 2013-07-31 | Discharge: 2013-07-31 | Disposition: A | Discharge status: Home / Self Care | Payer: 59 | Source: Ambulatory Visit | Attending: Internal Medicine | Admitting: Internal Medicine

## 2013-07-31 ENCOUNTER — Encounter: Payer: Self-pay | Admitting: Gastroenterology

## 2013-07-31 ENCOUNTER — Other Ambulatory Visit: Payer: Self-pay

## 2013-07-31 VITALS — BP 112/75 | HR 77 | Temp 97.4°F | Ht 69.0 in | Wt 210.4 lb

## 2013-07-31 DIAGNOSIS — R17 Unspecified jaundice: Secondary | ICD-10-CM | POA: Insufficient documentation

## 2013-07-31 DIAGNOSIS — K838 Other specified diseases of biliary tract: Secondary | ICD-10-CM | POA: Insufficient documentation

## 2013-07-31 DIAGNOSIS — R634 Abnormal weight loss: Secondary | ICD-10-CM

## 2013-07-31 LAB — BASIC METABOLIC PANEL
BUN: 22 mg/dL (ref 6–23)
CHLORIDE: 100 meq/L (ref 96–112)
CO2: 22 meq/L (ref 19–32)
Calcium: 10.1 mg/dL (ref 8.4–10.5)
Creatinine, Ser: 0.8 mg/dL (ref 0.50–1.35)
GFR calc Af Amer: >90 mL/min (ref >90)
GFR calc non Af Amer: >90 mL/min (ref >90)
Glucose, Bld: 163 mg/dL — ABNORMAL HIGH (ref 70–99)
Potassium: 4.9 mEq/L (ref 3.7–5.3)
SODIUM: 138 meq/L (ref 137–147)

## 2013-07-31 LAB — CBC
HCT: 43 % (ref 39.0–52.0)
Hemoglobin: 14.3 g/dL (ref 13.0–17.0)
MCH: 30.6 pg (ref 26.0–34.0)
MCHC: 33.3 g/dL (ref 30.0–36.0)
MCV: 91.9 fL (ref 78.0–100.0)
PLATELETS: 208 10*3/uL (ref 150–400)
RBC: 4.68 MIL/uL (ref 4.22–5.81)
RDW: 16.7 % — ABNORMAL HIGH (ref 11.5–15.5)
WBC: 9.1 10*3/uL (ref 4.0–10.5)

## 2013-07-31 LAB — LIPASE, BLOOD: LIPASE: 40 U/L (ref 11–59)

## 2013-07-31 NOTE — Progress Notes (Signed)
Primary Care Physician:  [name], MD  Primary Gastroenterologist:  Barney Drain, MD   Chief Complaint  Patient presents with  . Follow-up    HPI:  Bryce Daugherty is a 68 y.o. male here for urgent office visit with regards to jaundice. Patient last seen at time of screening colonoscopy with Dr. Oneida Alar in 2012.  States he started noticing a change in his skin color about 4 weeks ago when he was on vacation. Started having nausea especially when he tried to eat. His only able to tolerate very light foods. He estimates at least a 12 pound weight loss. One week ago he noted a significant change in his skin coloration, his urine was brown in color, stools became lighter. He went to the [facility] and had some blood work done. Labs as outlined below. Complains of postprandial nausea. Severe fatigue. Pp bloating. Taking ibuprofen for headache.  BM looser.   Abdominal ultrasound showed common bile duct diameter of 32 mL. Echogenic material within the common bile duct. Pancreas not well visualized.   Current Outpatient Prescriptions  Medication Sig Dispense Refill  . aspirin EC 81 MG tablet Take 81 mg by mouth daily. **patient holding due to procedure**      . atenolol (TENORMIN) 25 MG tablet Take 25 mg by mouth daily before breakfast.       . doxazosin (CARDURA) 8 MG tablet Take 4 mg by mouth daily before breakfast.       . glipiZIDE (GLUCOTROL) 5 MG tablet Take 5 mg by mouth daily before breakfast.       . levofloxacin (LEVAQUIN) 500 MG tablet Take 500 mg by mouth daily.       . metFORMIN (GLUCOPHAGE) 500 MG tablet Take 500 mg by mouth daily before breakfast.       No current facility-administered medications for this visit.    Allergies as of 07/31/2013  . (No Known Allergies)    Past Medical History  Diagnosis Date  . Hypertension   . Chronic kidney disease   . Arthritis     lower back, hips knees  . Kidney stones     bilateral stones pain since 04/11/11  . Diabetes mellitus      treatment for 2 years    Past Surgical History  Procedure Laterality Date  . Cystectomy  under left arm  . Colonoscopy  03/24/2011    [id number] hemorrhoids  . Cholecystectomy      1984  . Catarac both eyes    . Tonsillectomy      Family History  Problem Relation Age of Onset  . Pancreatic cancer Mother     age 18  . Colon cancer Neg Hx   . Heart attack Father     History   Social History  . Marital Status: Married    Spouse Name: N/A    Number of Children: N/A  . Years of Education: N/A   Occupational History  . [employer] and [employer]    Social History Main Topics  . Smoking status: Former Smoker -- 1.00 packs/day for 20 years    Types: Cigarettes    Quit date: 04/26/1988  . Smokeless tobacco: Not on file     Comment: quit smoking about 25 years ago  . Alcohol Use: No  . Drug Use: No  . Sexual Activity:    Other Topics Concern  . Not on file   Social History Narrative  . No narrative on file  ROS:  General: Complains of anorexia and weight loss, fatigue. No chills or fever. Eyes: Negative for vision changes.  ENT: Negative for hoarseness, difficulty swallowing , nasal congestion. CV: Negative for chest pain, angina, palpitations, dyspnea on exertion, peripheral edema.  Respiratory: Negative for dyspnea at rest, dyspnea on exertion, cough, sputum, wheezing.  GI: See history of present illness. GU:  Negative for dysuria, hematuria, urinary incontinence, urinary frequency, nocturnal urination.  MS: Negative for joint pain, low back pain.  Derm: Negative for rash or itching. Complains of jaundice Neuro: Negative for weakness, abnormal sensation, seizure, frequent headaches, memory loss, confusion.  Psych: Negative for anxiety, depression, suicidal ideation, hallucinations.  Endo: See history of present illness Heme: Negative for bruising or bleeding. Allergy: Negative for rash or hives.    Physical Examination:  BP 112/75  Pulse 77  Temp(Src)  97.4 F (36.3 C) (Oral)  Ht 5\' 9"  (1.753 m)  Wt 210 lb 6.4 oz (95.437 kg)  BMI 31.06 kg/m2   General: Well developed, no acute distress. Appears fatigued.   Head: Normocephalic, atraumatic.   Eyes: Conjunctiva pink, positive scleral icterus  Mouth: Oropharyngeal mucosa moist and pink , no lesions erythema or exudate. Neck: Supple without thyromegaly, masses, or lymphadenopathy.  Lungs: Clear to auscultation bilaterally.  Heart: Regular rate and rhythm, no murmurs rubs or gallops.  Abdomen: Bowel sounds are normal, nontender, nondistended, no hepatosplenomegaly or masses, no abdominal bruits or    hernia , no rebound or guarding.   Rectal: Not performed Extremities: No lower extremity edema. No clubbing or deformities.  Neuro: Alert and oriented x 4 , grossly normal neurologically.  Skin: Warm and dry, no rash. Positive jaundice.   Psych: Alert and cooperative, normal mood and affect.  Labs: Labs from 07/24/2013 Total bilirubin 11.7, alkaline phosphatase 463, AST 252, ALT 281, albumin 3.3, calcium 9.2, GGT 1665, creatinine 0.59, glucose 116, white blood cell count 8200, hemoglobin 14.9, platelets 169,000  Imaging Studies: US Abdomen Complete  07/27/2013   CLINICAL DATA:  Jaundice, weight loss  EXAM: ULTRASOUND ABDOMEN COMPLETE  COMPARISON:  DG ABDOMEN 1V dated 06/05/2011; CT ABD/PELVIS W CM dated 04/20/2011  FINDINGS: Gallbladder:  Surgically absent.  Common bile duct:  Diameter: 32 mm. There is echogenic material within the common bile duct.  Liver:  No focal lesion identified. The previously demonstrated mass on the CT of the abdomen dated 04/20/2011 is not appreciated on the provided images. Within normal limits in parenchymal echogenicity.  IVC:  No abnormality visualized.  Pancreas:  Visualized portion unremarkable.  Spleen:  Size within normal limits.  Right Kidney:  Length: 11 cm. Normal echogenicity. There is a 1.5 cm anechoic lower pole renal mass most consistent with a cyst. There  are small echogenic foci within the right kidney likely representing nonobstructing nephrolithiasis.  Left Kidney:  Length: 12 cm. Normal echogenicity. There is a 9 mm anechoic lower pole renal mass most consistent with a cyst. There are small echogenic foci within the left kidney likely representing nonobstructing nephrolithiasis.  Abdominal aorta:  No aneurysm visualized.  Other findings:  None.  IMPRESSION: 1. Dilated common bile duct measuring 3.2 cm with echogenic material within the distal common bile duct which may reflect blood clot versus noncalcified gallstone. Recommend further evaluation with MRCP.   Electronically Signed   By: Kathreen Devoid   On: 07/27/2013 13:44

## 2013-07-31 NOTE — Telephone Encounter (Signed)
T/C from Pam at [facility]. Pt has been rescheduled to Thurs 08/03/2013 for his MRCP with Dr. Gala Romney, she said per Dr. Roseanne Kaufman request. He will need more Levaquin sent to the pharmacy. Per Neil Crouch, PA. I called in Levaquin 500 mg #3 to take one daily to [facility].

## 2013-07-31 NOTE — Assessment & Plan Note (Signed)
68 year old gentleman who presents with essentially painless jaundice, anorexia, weight loss of about 4 weeks' duration. LFTs significantly abnormal. Abdominal ultrasound shows common bile duct diameter of 3 cm with echogenic material within the distal common bile duct. Patient was placed on Levaquin on Friday to try and prevent cholangitis. He's had no evidence of chills or fever. Patient needs ERCP with sphincterotomy, possible stone extraction, bile duct brushings, possible stent placement based on findings. Plans for tomorrow. We'll update his LFTs and lipase during preop as well. If he develops chills or fever, pain in the meantime he should present to ER. The risks, benefits, limitations, alternatives, and imponderables have been reviewed with the patient. I specifically discussed a1 in 10 chance of pancreatitis, reaction to medications, bleeding, perforation and the possibility of a failed ERCP. Potential for sphincterotomy and stent placement also reviewed. Questions have been answered. All parties agreeable.

## 2013-07-31 NOTE — Patient Instructions (Addendum)
Your procedure is scheduled on: 08/03/2013  Report to [facility] at   9:15  AM.  Call this number if you have problems the morning of surgery: [phone]   Remember:   Do not drink or eat food:After Midnight.  :  Take these medicines the morning of surgery with A SIP OF WATER:    Do not wear jewelry, make-up or nail polish.  Do not wear lotions, powders, or perfumes. You may wear deodorant.  Do not shave 48 hours prior to surgery. Men may shave face and neck.  Do not bring valuables to the hospital.  Contacts, dentures or bridgework may not be worn into surgery.  Leave suitcase in the car. After surgery it may be brought to your room.  For patients admitted to the hospital, checkout time is 11:00 AM the day of discharge.   Patients discharged the day of surgery will not be allowed to drive home.    Special Instructions: Shower using CHG night before surgery and shower the day of surgery use CHG.  Use special wash - you have one bottle of CHG for all showers.  You should use approximately 1/2 of the bottle for each shower.   Please read over the following fact sheets that you were given: Pain Booklet, MRSA Information, Surgical Site Infection Prevention and Care and Recovery After Surgery   Endoscopic Retrograde Cholangiopancreatography (ERCP) Endoscopic retrograde cholangiopancreatography (ERCP) is a procedure used to diagnosis many diseases of the pancreas, bile ducts, liver, and gallbladder. During ERCP a thin, lighted tube (endoscope) is passed through the mouth and down the back of the throat into the first part of the small intestine (duodenum). A small, plastic tube (cannula) is then passed through the endoscope and directed into the bile duct or pancreatic duct. Dye is then injected through the cannula and X-rays are taken to study the biliary and pancreatic passageways.  LET [facility] CARE PROVIDER KNOW ABOUT:   Any allergies you have.   All medicines you are taking,  including vitamins, herbs, eyedrops, creams, and over-the-counter medicines.   Previous problems you or members of your family have had with the use of anesthetics.   Any blood disorders you have.   Previous surgeries you have had.   Medical conditions you have. RISKS AND COMPLICATIONS Generally, ERCP is a safe procedure. However, as with any procedure, complications can occur. A simple removal of gallstones has the lowest rate of complications. Higher rates of complication occur in people who have poorly functioning bile or pancreatic ducts. Possible complications include:   Pancreatitis.  Bleeding.  Accidental punctures in the bowel wall, pancreas, or gall bladder.  Gall bladder or bile duct infection. BEFORE THE PROCEDURE   Do not eat or drink anything, including water, for at least 8 hours before the procedure or as directed by your health care provider.   Ask your health care provider whether you should stop taking certain medicines prior to your procedure.   Arrange for someone to drive you home. You will not be allowed to drive for 12 24 hours after the procedure. PROCEDURE   You will be given medicine through a vein (intravenously) to make you relaxed and sleepy.   You might have a breathing tube placed to give you medicine that makes you sleep (general anesthetic).   Your throat may be sprayed with medicine that numbs the area and prevents gagging (local anesthetic), or you may gargle this medicine.   You will lie on your left  side.   The endoscope will be inserted through your mouth and into the duodenum. The tube will not interfere with your breathing. Gagging is prevented by the anesthesia.   While X-rays are being taken, you may be positioned on your stomach.   A small sample of tissue (biopsy) may be removed for examination. AFTER THE PROCEDURE   You will rest in bed until you are fully conscious.   When you first wake up, your throat may feel  slightly sore.   You will not be allowed to eat or drink until numbness subsides.   Once you are able to drink, urinate, and sit on the edge of the bed without feeling sick to your stomach (nauseous) or dizzy, you may be allowed to go home. Document Released: 12/30/2000 Document Revised: 01/25/2013 Document Reviewed: 11/15/2012 [facility] Patient Information 2014 [place], [place]. [phone]+-*

## 2013-07-31 NOTE — Telephone Encounter (Signed)
LMOM for pt that the [facility] has [facility] and to call if he has questions.

## 2013-07-31 NOTE — Patient Instructions (Signed)
1. ERCP as scheduled.

## 2013-08-01 ENCOUNTER — Encounter (HOSPITAL_COMMUNITY): Payer: Self-pay | Admitting: Pharmacy Technician

## 2013-08-01 NOTE — Telephone Encounter (Signed)
Patient is scheduled for ERCP.

## 2013-08-01 NOTE — Progress Notes (Signed)
cc'd to pcp 

## 2013-08-02 ENCOUNTER — Other Ambulatory Visit: Payer: Self-pay | Admitting: Internal Medicine

## 2013-08-02 DIAGNOSIS — R7989 Other specified abnormal findings of blood chemistry: Secondary | ICD-10-CM

## 2013-08-02 DIAGNOSIS — R945 Abnormal results of liver function studies: Secondary | ICD-10-CM

## 2013-08-03 ENCOUNTER — Encounter (HOSPITAL_COMMUNITY)
Admission: RE | Disposition: A | Discharge status: Home / Self Care | Payer: Self-pay | Source: Ambulatory Visit | Attending: Pulmonary Disease

## 2013-08-03 ENCOUNTER — Encounter (HOSPITAL_COMMUNITY): Payer: 59 | Admitting: Anesthesiology

## 2013-08-03 ENCOUNTER — Encounter (HOSPITAL_COMMUNITY): Payer: Self-pay

## 2013-08-03 ENCOUNTER — Ambulatory Visit (HOSPITAL_COMMUNITY): Payer: 59

## 2013-08-03 ENCOUNTER — Other Ambulatory Visit: Payer: Self-pay

## 2013-08-03 ENCOUNTER — Observation Stay (HOSPITAL_COMMUNITY)
Admission: RE | Admit: 2013-08-03 | Discharge: 2013-08-04 | Disposition: A | Discharge status: Home / Self Care | Payer: 59 | Source: Ambulatory Visit | Attending: Pulmonary Disease | Admitting: Pulmonary Disease

## 2013-08-03 ENCOUNTER — Ambulatory Visit (HOSPITAL_COMMUNITY): Payer: 59 | Admitting: Anesthesiology

## 2013-08-03 DIAGNOSIS — K5710 Diverticulosis of small intestine without perforation or abscess without bleeding: Secondary | ICD-10-CM | POA: Insufficient documentation

## 2013-08-03 DIAGNOSIS — E119 Type 2 diabetes mellitus without complications: Secondary | ICD-10-CM

## 2013-08-03 DIAGNOSIS — R945 Abnormal results of liver function studies: Secondary | ICD-10-CM

## 2013-08-03 DIAGNOSIS — R7989 Other specified abnormal findings of blood chemistry: Secondary | ICD-10-CM

## 2013-08-03 DIAGNOSIS — R634 Abnormal weight loss: Secondary | ICD-10-CM

## 2013-08-03 DIAGNOSIS — K8050 Calculus of bile duct without cholangitis or cholecystitis without obstruction: Secondary | ICD-10-CM

## 2013-08-03 DIAGNOSIS — Z87442 Personal history of urinary calculi: Secondary | ICD-10-CM | POA: Insufficient documentation

## 2013-08-03 DIAGNOSIS — K838 Other specified diseases of biliary tract: Secondary | ICD-10-CM

## 2013-08-03 DIAGNOSIS — R17 Unspecified jaundice: Secondary | ICD-10-CM

## 2013-08-03 DIAGNOSIS — Z87891 Personal history of nicotine dependence: Secondary | ICD-10-CM | POA: Insufficient documentation

## 2013-08-03 DIAGNOSIS — Z01812 Encounter for preprocedural laboratory examination: Secondary | ICD-10-CM | POA: Insufficient documentation

## 2013-08-03 DIAGNOSIS — I10 Essential (primary) hypertension: Secondary | ICD-10-CM

## 2013-08-03 DIAGNOSIS — N400 Enlarged prostate without lower urinary tract symptoms: Secondary | ICD-10-CM | POA: Insufficient documentation

## 2013-08-03 DIAGNOSIS — M129 Arthropathy, unspecified: Secondary | ICD-10-CM | POA: Insufficient documentation

## 2013-08-03 DIAGNOSIS — K831 Obstruction of bile duct: Secondary | ICD-10-CM | POA: Diagnosis present

## 2013-08-03 DIAGNOSIS — Z7982 Long term (current) use of aspirin: Secondary | ICD-10-CM | POA: Insufficient documentation

## 2013-08-03 DIAGNOSIS — Z01810 Encounter for preprocedural cardiovascular examination: Secondary | ICD-10-CM | POA: Insufficient documentation

## 2013-08-03 DIAGNOSIS — I129 Hypertensive chronic kidney disease with stage 1 through stage 4 chronic kidney disease, or unspecified chronic kidney disease: Secondary | ICD-10-CM | POA: Insufficient documentation

## 2013-08-03 DIAGNOSIS — N189 Chronic kidney disease, unspecified: Secondary | ICD-10-CM | POA: Insufficient documentation

## 2013-08-03 DIAGNOSIS — C240 Malignant neoplasm of extrahepatic bile duct: Principal | ICD-10-CM | POA: Insufficient documentation

## 2013-08-03 HISTORY — PX: BILIARY STENT PLACEMENT: SHX5538

## 2013-08-03 HISTORY — PX: REMOVAL OF STONES: SHX5545

## 2013-08-03 HISTORY — PX: SPHINCTEROTOMY: SHX5544

## 2013-08-03 HISTORY — PX: ERCP: SHX5425

## 2013-08-03 LAB — HEPATIC FUNCTION PANEL
ALT: 299 U/L — AB (ref 0–53)
AST: 312 U/L — AB (ref 0–37)
Albumin: 2.6 g/dL — ABNORMAL LOW (ref 3.5–5.2)
Alkaline Phosphatase: 522 U/L — ABNORMAL HIGH (ref 39–117)
BILIRUBIN INDIRECT: 7.6 mg/dL — AB (ref 0.3–0.9)
Bilirubin, Direct: 18.9 mg/dL — ABNORMAL HIGH (ref 0.0–0.3)
TOTAL PROTEIN: 6 g/dL (ref 6.0–8.3)
Total Bilirubin: 26.5 mg/dL (ref 0.3–1.2)

## 2013-08-03 LAB — GLUCOSE, CAPILLARY
GLUCOSE-CAPILLARY: 187 mg/dL — AB (ref 70–99)
GLUCOSE-CAPILLARY: 201 mg/dL — AB (ref 70–99)
Glucose-Capillary: 191 mg/dL — ABNORMAL HIGH (ref 70–99)
Glucose-Capillary: 172 mg/dL — ABNORMAL HIGH (ref 70–99)

## 2013-08-03 SURGERY — ERCP, WITH INTERVENTION IF INDICATED
Anesthesia: General | Site: Esophagus

## 2013-08-03 MED ORDER — FENTANYL CITRATE 0.05 MG/ML IJ SOLN
25.0000 ug | INTRAMUSCULAR | Status: DC | PRN
  Administered 2013-08-03 (×2): 50 ug via INTRAVENOUS

## 2013-08-03 MED ORDER — SODIUM CHLORIDE 0.9 % IV SOLN
INTRAVENOUS | Status: AC
  Filled 2013-08-03: qty 100

## 2013-08-03 MED ORDER — ONDANSETRON HCL 4 MG/2ML IJ SOLN: 4.0000 mg | Freq: Once | INTRAMUSCULAR | Status: DC | PRN

## 2013-08-03 MED ORDER — LIDOCAINE HCL 1 % IJ SOLN
INTRAMUSCULAR | Status: DC | PRN
  Administered 2013-08-03: 30 mg via INTRADERMAL

## 2013-08-03 MED ORDER — IOHEXOL 350 MG/ML SOLN
INTRAVENOUS | Status: DC | PRN
  Administered 2013-08-03 (×2): 50 mL

## 2013-08-03 MED ORDER — SODIUM CHLORIDE 0.9 % IV SOLN: INTRAVENOUS | Status: DC | PRN

## 2013-08-03 MED ORDER — EPHEDRINE SULFATE 50 MG/ML IJ SOLN
INTRAMUSCULAR | Status: AC
  Filled 2013-08-03: qty 1

## 2013-08-03 MED ORDER — LACTATED RINGERS IV SOLN
INTRAVENOUS | Status: DC
  Administered 2013-08-03 (×2): via INTRAVENOUS

## 2013-08-03 MED ORDER — PROPOFOL 10 MG/ML IV BOLUS
INTRAVENOUS | Status: DC | PRN
  Administered 2013-08-03: 160 mg via INTRAVENOUS

## 2013-08-03 MED ORDER — ROCURONIUM BROMIDE 50 MG/5ML IV SOLN
INTRAVENOUS | Status: AC
  Filled 2013-08-03: qty 1

## 2013-08-03 MED ORDER — SUCCINYLCHOLINE CHLORIDE 20 MG/ML IJ SOLN
INTRAMUSCULAR | Status: DC | PRN
  Administered 2013-08-03: 120 mg via INTRAVENOUS

## 2013-08-03 MED ORDER — SODIUM CHLORIDE 0.9 % IV SOLN: INTRAVENOUS | Status: DC

## 2013-08-03 MED ORDER — GLYCOPYRROLATE 0.2 MG/ML IJ SOLN
0.2000 mg | Freq: Once | INTRAMUSCULAR | Status: AC
  Administered 2013-08-03: 0.2 mg via INTRAVENOUS

## 2013-08-03 MED ORDER — ACETAMINOPHEN 325 MG PO TABS: 650.0000 mg | ORAL_TABLET | Freq: Four times a day (QID) | ORAL | Status: DC | PRN

## 2013-08-03 MED ORDER — STERILE WATER FOR IRRIGATION IR SOLN
Status: DC | PRN
  Administered 2013-08-03: 12:00:00

## 2013-08-03 MED ORDER — ONDANSETRON HCL 4 MG PO TABS: 4.0000 mg | ORAL_TABLET | Freq: Four times a day (QID) | ORAL | Status: DC | PRN

## 2013-08-03 MED ORDER — ROCURONIUM BROMIDE 100 MG/10ML IV SOLN
INTRAVENOUS | Status: DC | PRN
  Administered 2013-08-03: 5 mg via INTRAVENOUS

## 2013-08-03 MED ORDER — GLYCOPYRROLATE 0.2 MG/ML IJ SOLN
INTRAMUSCULAR | Status: AC
  Filled 2013-08-03: qty 1

## 2013-08-03 MED ORDER — MIDAZOLAM HCL 2 MG/2ML IJ SOLN
INTRAMUSCULAR | Status: AC
  Filled 2013-08-03: qty 2

## 2013-08-03 MED ORDER — FENTANYL CITRATE 0.05 MG/ML IJ SOLN
INTRAMUSCULAR | Status: AC
  Filled 2013-08-03: qty 5

## 2013-08-03 MED ORDER — ONDANSETRON HCL 4 MG/2ML IJ SOLN: 4.0000 mg | Freq: Four times a day (QID) | INTRAMUSCULAR | Status: DC | PRN

## 2013-08-03 MED ORDER — LIDOCAINE HCL (PF) 1 % IJ SOLN
INTRAMUSCULAR | Status: AC
  Filled 2013-08-03: qty 5

## 2013-08-03 MED ORDER — MIDAZOLAM HCL 2 MG/2ML IJ SOLN
1.0000 mg | INTRAMUSCULAR | Status: DC | PRN
  Administered 2013-08-03 (×2): 2 mg via INTRAVENOUS
  Filled 2013-08-03: qty 2

## 2013-08-03 MED ORDER — SODIUM CHLORIDE BACTERIOSTATIC 0.9 % IJ SOLN
INTRAMUSCULAR | Status: AC
  Filled 2013-08-03: qty 10

## 2013-08-03 MED ORDER — INSULIN ASPART 100 UNIT/ML ~~LOC~~ SOLN
0.0000 [IU] | Freq: Three times a day (TID) | SUBCUTANEOUS | Status: DC
  Administered 2013-08-03: 2 [IU] via SUBCUTANEOUS
  Administered 2013-08-04: 1 [IU] via SUBCUTANEOUS

## 2013-08-03 MED ORDER — SUCCINYLCHOLINE CHLORIDE 20 MG/ML IJ SOLN
INTRAMUSCULAR | Status: AC
  Filled 2013-08-03: qty 1

## 2013-08-03 MED ORDER — AMPICILLIN-SULBACTAM SODIUM 3 (2-1) G IJ SOLR
3.0000 g | Freq: Once | INTRAMUSCULAR | Status: AC
  Administered 2013-08-03: 3 g via INTRAVENOUS
  Filled 2013-08-03: qty 3

## 2013-08-03 MED ORDER — FENTANYL CITRATE 0.05 MG/ML IJ SOLN
INTRAMUSCULAR | Status: DC | PRN
  Administered 2013-08-03 (×2): 50 ug via INTRAVENOUS

## 2013-08-03 MED ORDER — EPHEDRINE SULFATE 50 MG/ML IJ SOLN
INTRAMUSCULAR | Status: DC | PRN
  Administered 2013-08-03 (×2): 10 mg via INTRAVENOUS
  Administered 2013-08-03: 5 mg via INTRAVENOUS

## 2013-08-03 MED ORDER — FENTANYL CITRATE 0.05 MG/ML IJ SOLN
INTRAMUSCULAR | Status: AC
  Filled 2013-08-03: qty 2

## 2013-08-03 MED ORDER — ONDANSETRON HCL 4 MG/2ML IJ SOLN
INTRAMUSCULAR | Status: AC
  Filled 2013-08-03: qty 2

## 2013-08-03 MED ORDER — ACETAMINOPHEN 650 MG RE SUPP: 650.0000 mg | Freq: Four times a day (QID) | RECTAL | Status: DC | PRN

## 2013-08-03 MED ORDER — PROPOFOL 10 MG/ML IV BOLUS
INTRAVENOUS | Status: AC
  Filled 2013-08-03: qty 20

## 2013-08-03 MED ORDER — ONDANSETRON HCL 4 MG/2ML IJ SOLN
4.0000 mg | Freq: Once | INTRAMUSCULAR | Status: AC
  Administered 2013-08-03: 4 mg via INTRAVENOUS

## 2013-08-03 MED ORDER — GLUCAGON HCL (RDNA) 1 MG IJ SOLR
INTRAMUSCULAR | Status: AC
  Filled 2013-08-03: qty 2

## 2013-08-03 SURGICAL SUPPLY — 20 items
BALLN DILATATION 2CMX10MM (BALLOONS) ×1
BALLN DILATATION 2X10 (BALLOONS) ×1
BALLN DILTN BIL 2X10 (BALLOONS) ×1 IMPLANT
BALLN RETRIEVAL 12X15 (BALLOONS) ×2 IMPLANT
BALLN RETRIEVAL 12X15MM (BALLOONS) ×1
BASKET TRAPEZOID LITHO 2.0X5 (MISCELLANEOUS) ×3 IMPLANT
BLOCK BITE 60FR ADLT L/F BLUE (MISCELLANEOUS) ×3 IMPLANT
DEVICE INFLATION ENCORE 26 (MISCELLANEOUS) ×3 IMPLANT
DEVICE LOCKING W-BIOPSY CAP (MISCELLANEOUS) ×3 IMPLANT
FLOOR PAD 36X40 (MISCELLANEOUS) ×3
KIT CLEAN ENDO COMPLIANCE (KITS) ×3 IMPLANT
PAD FLOOR 36X40 (MISCELLANEOUS) ×1 IMPLANT
ROTH PLATINUM NET UNIVERSAL (MISCELLANEOUS) ×3 IMPLANT
SPHINCTEROTOME HYDRATOME 44 (MISCELLANEOUS) ×3 IMPLANT
STENT RX PRELOADED 10FRX12CM (Stent) ×3 IMPLANT
SYR 20CC LL (SYRINGE) ×3 IMPLANT
SYR 50ML LL SCALE MARK (SYRINGE) ×3 IMPLANT
SYSTEM CONTINUOUS INJECTION (MISCELLANEOUS) ×3 IMPLANT
TUBING ENDO SMARTCAP PENTAX (MISCELLANEOUS) ×3 IMPLANT
WATER STERILE IRR 1000ML POUR (IV SOLUTION) ×3 IMPLANT

## 2013-08-03 NOTE — Op Note (Signed)
Bryce Daugherty, Bryce Daugherty                 ACCOUNT NO.:  [id number]  MEDICAL RECORD NO.:  [id number]  LOCATION:  A201                          FACILITY:  [facility]  PHYSICIAN:  R. Garfield Cornea, MD [place]:  20-Feb-1946  DATE OF PROCEDURE:  08/03/2013 DATE OF DISCHARGE:                              OPERATIVE REPORT   PROCEDURE:  ERCP with biliary sphincterotomy.  Sphincterotomy balloon dilation.  Balloon stone extraction and plastic stent placement.  INDICATIONS FOR PROCEDURE:  A 68 year old gentleman with a 1 month history of painless jaundice.  He was deeply jaundiced with a bilirubin of 26 today.  Ultrasound of the abdomen demonstrates a dilated biliary tree with the common bile duct, reportedly dilated at 32 mm with debris within the bile duct.  Gallbladder was removed in the distant past (cholelithiasis).  ERCP is now being done for further evaluate his symptoms, decompress the biliary tree etc.  I have discussed this approach with the patient at length.  Risks, benefits, limitations, alternatives have been discussed on multiple occasions including today. Please see the addendum to the H and P and a consultation note for more information.  PROCEDURE NOTE:  General endotracheal anesthesia was induced by Dr. [facility].  The patient was placed in semiprone position on the OR table.  INSTRUMENTATION:  Pentax video chip system.  Unasyn IV was given preoperatively, Wandra Mannan, the [employer] rep was present for the procedure.  FINDINGS:  Cursory examination of the distal esophagus and stomach revealed no abnormalities.  Pylorus was patent and easily traversed with the scope.  The scope was advanced on the second portion of the duodenum and pulled back to the short position 55 cm from the incisors.  The ampulla appeared normal.  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  Scout film was taken.   Utilized Microvasive Sphincterotome, the ampulla was approached.  Using guidewire palpation, almost instantaneously obtained deep biliary cannulation.  Cholangiogram was performed. There was irregular filling defects at the confluence in the right hepatic duct and distally and there was what appeared to be a small filling defect consistent with a stone.  However, at most, the common hepatic duct was about 10 mm in diameter.  The right and left hepatics were dilated at about 14 mm each; however, the biliary tree was no more dilated than that. There appeared to be smooth taper distally.  The pancreatic duct was partially opacified during the cholangiogram; the distal 3-4 cm appeared normal without obvious stricture or other abnormality.  At the confluence there appeared to be an meniscus.  This area did not fill out very well but it was more due to filling defects then a tight critical stricture, the wire was advanced deep into the confluence and into the right hepatic duct without any difficulty.  Keeping the safety wire in position, the Sphincterotome was pulled back across the ampullary orifice and at the 12 o'clock position, an 8 mm sphincterotomy was performed without difficulty or apparent complication.  This was felt to be an inadequate orifice for delivery of stone material; but due to a short intramural segment,  I could not cut further, subsequently I placed the 10 cm x 40 mm Hurricane balloon across the ampullary orifice (railed over the guidewire), it was inflated fully to 10 mm and kept for 1 minute and then deflated.  This did open up the ampullary orifice further without apparent complication.  We railed this off.  Using a 12 mm extractor balloon, I advanced at the confluence and performed a balloon occlusion cholangiogram.  With this maneuver, there was some fresh blood and clot recovered and a large gelatinous lump of material. It was grasped with the trapezoid basket and  removed from the patient. We sent to Cytology.  I made a second pass as there was residual filling defect at the confluence and repeated this maneuver with recovery of An even larger similar piece of material with what appeared to be a black pigment stone adherent to it.  Please see multiple photos taken.  This too was grasped this time, with a [employer], and removed from the patient.  It will be also submitted to the pathologist.  I did not identify any residual filling defects or I did not see a definite stricture.  I elected to go ahead and placed a stent as there certainly could be some blood clot or residual debris. In the upstream biliary tree; there was some residual contrast present.  I introduced the safety wire deep into the biliary tree once again, railed off the Sphincterotome and railed on a 10-French, 12 cm between the flaps plastic stent was railed into a very good position and then with this maneuver, there was a clear yellow bile that flowed through the side port.  There was some blood clot that was extruded around the stent through the ampullary orifice with this maneuver.  I reviewed the images with the radiologist, Dr. Thornton Papas, following the procedure.  The patient tolerated the procedure well and was taken to PACU.  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  Status post balloon stone extraction with recovery of clot and foreign material, status post plastic stent placement as described above.  I did not see obvious neoplasia anywhere today, although his presentation is suspicious.  RECOMMENDATIONS:  We will keep him overnight in the hospital and recheck his labs tomorrow morning.  He will need a repeat ERCP with the stent removal at the appropriate interval.  We will consider cholangioscopy at that time.  Follow up on cytology/pathology.     Bridgette Habermann, MD  FACP [facility]     RMR/MEDQ  D:  08/03/2013  T:  08/03/2013  Job:  [id number]

## 2013-08-03 NOTE — Anesthesia Preprocedure Evaluation (Signed)
Anesthesia Evaluation  Patient identified by MRN, date of birth, ID band Patient awake    Reviewed: Allergy & Precautions, H&P , NPO status , Patient's Chart, lab work & pertinent test results, reviewed documented beta blocker date and time   Airway Mallampati: I TM Distance: >3 FB Neck ROM: Full    Dental  (+) Poor Dentition, Chipped, Missing,    Pulmonary former smoker,  breath sounds clear to auscultation        Cardiovascular hypertension, Pt. on medications and Pt. on home beta blockers Rhythm:Regular Rate:Normal     Neuro/Psych    GI/Hepatic CBD stones, nausea   Endo/Other  diabetes, Type 2, Oral Hypoglycemic Agents  Renal/GU Renal disease     Musculoskeletal   Abdominal   Peds  Hematology   Anesthesia Other Findings   Reproductive/Obstetrics                           Anesthesia Physical Anesthesia Plan  ASA: III  Anesthesia Plan: General   Post-op Pain Management:    Induction: Intravenous, Rapid sequence and Cricoid pressure planned  Airway Management Planned: Oral ETT  Additional Equipment:   Intra-op Plan:   Post-operative Plan: Extubation in OR  Informed Consent: I have reviewed the patients History and Physical, chart, labs and discussed the procedure including the risks, benefits and alternatives for the proposed anesthesia with the patient or authorized representative who has indicated his/her understanding and acceptance.     Plan Discussed with:   Anesthesia Plan Comments:         Anesthesia Quick Evaluation

## 2013-08-03 NOTE — Transfer of Care (Signed)
Immediate Anesthesia Transfer of Care Note  Patient: Bryce Daugherty  Procedure(s) Performed: Procedure(s) with comments: ENDOSCOPIC RETROGRADE CHOLANGIOPANCREATOGRAPHY (ERCP) (N/A) SPHINCTEROTOMY (N/A) - with dilation  BILIARY STENT PLACEMENT (N/A) REMOVAL OF STONES (N/A)  Patient Location: PACU  Anesthesia Type:General  Level of Consciousness: awake and alert   Airway & Oxygen Therapy: Patient Spontanous Breathing  Post-op Assessment: Report given to PACU RN  Post vital signs: Reviewed  Complications: No apparent anesthesia complications

## 2013-08-03 NOTE — Addendum Note (Signed)
Addendum created 08/03/13 1408 by Vista Deck, CRNA   Modules edited: Anesthesia Flowsheet

## 2013-08-03 NOTE — Anesthesia Procedure Notes (Addendum)
Procedure Name: Intubation Date/Time: 08/03/2013 11:29 AM Performed by: Tressie Stalker E Pre-anesthesia Checklist: Patient identified, Patient being monitored, Timeout performed, Emergency Drugs available and Suction available Patient Re-evaluated:Patient Re-evaluated prior to inductionOxygen Delivery Method: Circle System Utilized Preoxygenation: Pre-oxygenation with 100% oxygen Intubation Type: IV induction, Rapid sequence and Cricoid Pressure applied Ventilation: Mask ventilation without difficulty Laryngoscope Size: Mac and 3 Grade View: Grade I Tube type: Oral Tube size: 7.0 mm Number of attempts: 1 Airway Equipment and Method: stylet Placement Confirmation: ETT inserted through vocal cords under direct vision,  positive ETCO2 and breath sounds checked- equal and bilateral Secured at: 22 cm Tube secured with: Tape Dental Injury: Teeth and Oropharynx as per pre-operative assessment

## 2013-08-03 NOTE — Anesthesia Postprocedure Evaluation (Signed)
  Anesthesia Post-op Note  Patient: Bryce Daugherty  Procedure(s) Performed: Procedure(s) with comments: ENDOSCOPIC RETROGRADE CHOLANGIOPANCREATOGRAPHY (ERCP) (N/A) SPHINCTEROTOMY (N/A) - with dilation  BILIARY STENT PLACEMENT (N/A) REMOVAL OF STONES (N/A)  Patient Location: PACU  Anesthesia Type:General  Level of Consciousness: awake and alert   Airway and Oxygen Therapy: Patient Spontanous Breathing and Patient connected to face mask oxygen  Post-op Pain: none  Post-op Assessment: Post-op Vital signs reviewed, Patient's Cardiovascular Status Stable, Respiratory Function Stable, Patent Airway and No signs of Nausea or vomiting  Post-op Vital Signs: Reviewed and stable  Last Vitals:  Filed Vitals:   08/03/13 1100  BP: 103/73  Pulse:   Temp:   Resp: 24    Complications: No apparent anesthesia complications

## 2013-08-03 NOTE — H&P (Signed)
History and Physical  Bryce Daugherty KGU:[id number] DOB: 1945-12-31 DOA: 08/03/2013   PCP: [name], MD   Chief Complaint: jaundice  HPI:  68 year old male with a history of hypertension, diabetes mellitus type 2, and nephrolithiasis presents with jaundice of approximately one month's duration. The patient first noticed some jaundice approximately one month ago. He has complained of associated fatigue and postprandial nausea without emesis, fevers, chills, dysuria, hematuria. He also complained of some intermittent abdominal cramping and tenderness. He went to see his [facility] in [place] on 07/24/2013. Labs at that time revealed AST 252, ALT 21, alkaline phosphatase is 463, total bilirubin 11.7. According to the patient, his [facility] physician wanted to perform a CT of the abdomen and pelvis. Unfortunately, he has to travel to run of [place] which he refused. As a result, he went to see his primary care physician Dr. Sinda Du. Hawkins ordered a CT of the abdomen and pelvis, but his insurance refused today. As a result abdominal ultrasound was obtained on 07/27/2013. Abdominal ultrasound showed common bile duct dilated at a 20 cm with distal echogenic material. As a result, the patient was sent to see gastroenterology. He went to see Dr. Sydell Axon, and an ERCP was scheduled for 08/03/2013. The patient has been on levofloxacin for Lasix days for prophylaxis against cholangitis. ERCP was performed earlier today. And dilatation and stent were placed today. Because of the difficulty of ERCP, the patient is admitted for observation. Currently, the patient denies fevers, chills, chest discomfort or shortness breath, vomiting, abdominal pain, dysuria, hematuria, headache, dizziness. He has lost approximately 12-15 pounds in the last month. Has not been any change in his medications. Labs on 07/31/2013 AST 312, ALT 299, a phosphatase of 22, total bilirubin 26.5, direct bilirubin  18.9. Assessment/Plan: Obstructive jaundice -Etiology unclear although it appears to be choledocholithiasis -May benefit from CT of the abdomen and pelvis, deferred decision to GI -Repeat LFTs in the morning -Clear liquid diet Diabetes mellitus type 2 -Hemoglobin A1c and -NovoLog sliding scale -Discontinue metformin and glipizide Hypertension -Discontinue Cardura and atenolol as the patient's blood pressure is soft Fatigue -TSH      Past Medical History  Diagnosis Date  . Hypertension   . Chronic kidney disease   . Arthritis     lower back, hips knees  . Kidney stones     bilateral stones pain since 04/11/11  . Diabetes mellitus     treatment for 2 years   Past Surgical History  Procedure Laterality Date  . Cystectomy  under left arm  . Colonoscopy  03/24/2011    SEG:[id number] hemorrhoids  . Cholecystectomy      1984  . Catarac both eyes    . Tonsillectomy     Social History:  reports that he quit smoking about 25 years ago. His smoking use included Cigarettes. He has a 20 pack-year smoking history. He does not have any smokeless tobacco history on file. He reports that he does not drink alcohol or use illicit drugs.   Family History  Problem Relation Age of Onset  . Pancreatic cancer Mother     age 16  . Colon cancer Neg Hx   . Heart attack Father      No Known Allergies    Prior to Admission medications   Medication Sig Start Date End Date Taking? Authorizing Provider  aspirin EC 81 MG tablet Take 81 mg by mouth daily. **patient holding due to procedure**   Yes  Historical Provider, MD  atenolol (TENORMIN) 25 MG tablet Take 25 mg by mouth daily before breakfast.    Yes Historical Provider, MD  doxazosin (CARDURA) 8 MG tablet Take 4 mg by mouth daily before breakfast.    Yes Historical Provider, MD  glipiZIDE (GLUCOTROL) 5 MG tablet Take 5 mg by mouth daily before breakfast.    Yes Historical Provider, MD  levofloxacin (LEVAQUIN) 500 MG tablet Take 500 mg  by mouth daily.  07/28/13  Yes Historical Provider, MD  metFORMIN (GLUCOPHAGE) 500 MG tablet Take 500 mg by mouth daily before breakfast.   Yes Historical Provider, MD    Review of Systems:  Constitutional:  No weight loss, night sweats,  fatigue.  Head&Eyes: No headache.  No vision loss.  No eye pain or scotoma ENT:  No Difficulty swallowing,Tooth/dental problems,Sore throat,  No ear ache, post nasal drip,  Cardio-vascular:  No chest pain, Orthopnea, PND, swelling in lower extremities,  dizziness, palpitations  GI:  No  abdominal pain, nausea, vomiting, diarrhea, loss of appetite, hematochezia, melena, heartburn, indigestion, Resp:  No shortness of breath with exertion or at rest. No cough. No coughing up of blood .No wheezing.No chest wall deformity  Skin:  no rash or lesions.  GU:  no dysuria, change in color of urine, no urgency or frequency. No flank pain.  Musculoskeletal:  No joint pain or swelling. No decreased range of motion. No back pain.  Psych:  No change in mood or affect. No depression or anxiety. Neurologic: No headache, no dysesthesia, no focal weakness, no vision loss. No syncope  Physical Exam: Filed Vitals:   08/03/13 1400 08/03/13 1415 08/03/13 1436 08/03/13 1438  BP: 98/58 105/63 113/74   Pulse: 59 57 53   Temp:   97.5 F (36.4 C)   TempSrc:      Resp: 14 15 14    Height:    5\' 9"  (1.753 m)  Weight:    94.892 kg (209 lb 3.2 oz)  SpO2: 98% 100% 97%    General:  A&O x 3, NAD, nontoxic, pleasant/cooperative Head/Eye: No conjunctival hemorrhage, no icterus, [place]/AT, No nystagmus ENT:  + icterus,  No thrush, good dentition, no pharyngeal exudate Neck:  No masses, no lymphadenpathy, no bruits CV:  RRR, no rub, no gallop, no S3 Lung:  CTAB, good air movement, no wheeze, no rhonchi Abdomen: soft/NT, +BS, nondistended, no peritoneal signs Ext: No cyanosis, No rashes, No petechiae, No lymphangitis, No edema; +jaundice   Labs on Admission:  Basic Metabolic  Panel:  Recent Labs Lab 07/31/13 1503  NA 138  K 4.9  CL 100  CO2 22  GLUCOSE 163*  BUN 22  CREATININE 0.80  CALCIUM 10.1   Liver Function Tests:  Recent Labs Lab 08/03/13 0940  AST 312*  ALT 299*  ALKPHOS 522*  BILITOT 26.5*  PROT 6.0  ALBUMIN 2.6*    Recent Labs Lab 07/31/13 1500  LIPASE 40   No results found for this basename: AMMONIA,  in the last 168 hours CBC:  Recent Labs Lab 07/31/13 1503  WBC 9.1  HGB 14.3  HCT 43.0  MCV 91.9  PLT 208   Cardiac Enzymes: No results found for this basename: CKTOTAL, CKMB, CKMBINDEX, TROPONINI,  in the last 168 hours BNP: No components found with this basename: POCBNP,  CBG:  Recent Labs Lab 08/03/13 1014 08/03/13 1255  GLUCAP 191* 172*    Radiological Exams on Admission: No results found.  EKG--sinus rhythm, no ST-T wave changes  Time spent:60 minutes Code Status:   FULL Family Communication:   wife at bedside   Orson Eva, DO  [facility] Pager [phone]  If 7PM-7AM, please contact night-coverage [email or web address] Password [facility] 08/03/2013, 3:48 PM

## 2013-08-03 NOTE — H&P (View-Only) (Signed)
Primary Care Physician:  [name], MD  Primary Gastroenterologist:  Barney Drain, MD   Chief Complaint  Patient presents with  . Follow-up    HPI:  Bryce Daugherty is a 68 y.o. male here for urgent office visit with regards to jaundice. Patient last seen at time of screening colonoscopy with Dr. Oneida Alar in 2012.  States he started noticing a change in his skin color about 4 weeks ago when he was on vacation. Started having nausea especially when he tried to eat. His only able to tolerate very light foods. He estimates at least a 12 pound weight loss. One week ago he noted a significant change in his skin coloration, his urine was brown in color, stools became lighter. He went to the [facility] and had some blood work done. Labs as outlined below. Complains of postprandial nausea. Severe fatigue. Pp bloating. Taking ibuprofen for headache.  BM looser.   Abdominal ultrasound showed common bile duct diameter of 32 mL. Echogenic material within the common bile duct. Pancreas not well visualized.   Current Outpatient Prescriptions  Medication Sig Dispense Refill  . aspirin EC 81 MG tablet Take 81 mg by mouth daily. **patient holding due to procedure**      . atenolol (TENORMIN) 25 MG tablet Take 25 mg by mouth daily before breakfast.       . doxazosin (CARDURA) 8 MG tablet Take 4 mg by mouth daily before breakfast.       . glipiZIDE (GLUCOTROL) 5 MG tablet Take 5 mg by mouth daily before breakfast.       . levofloxacin (LEVAQUIN) 500 MG tablet Take 500 mg by mouth daily.       . metFORMIN (GLUCOPHAGE) 500 MG tablet Take 500 mg by mouth daily before breakfast.       No current facility-administered medications for this visit.    Allergies as of 07/31/2013  . (No Known Allergies)    Past Medical History  Diagnosis Date  . Hypertension   . Chronic kidney disease   . Arthritis     lower back, hips knees  . Kidney stones     bilateral stones pain since 04/11/11  . Diabetes mellitus      treatment for 2 years    Past Surgical History  Procedure Laterality Date  . Cystectomy  under left arm  . Colonoscopy  03/24/2011    VCB:[id number] hemorrhoids  . Cholecystectomy      1984  . Catarac both eyes    . Tonsillectomy      Family History  Problem Relation Age of Onset  . Pancreatic cancer Mother     age 47  . Colon cancer Neg Hx   . Heart attack Father     History   Social History  . Marital Status: Married    Spouse Name: N/A    Number of Children: N/A  . Years of Education: N/A   Occupational History  . [employer] and [employer]    Social History Main Topics  . Smoking status: Former Smoker -- 1.00 packs/day for 20 years    Types: Cigarettes    Quit date: 04/26/1988  . Smokeless tobacco: Not on file     Comment: quit smoking about 25 years ago  . Alcohol Use: No  . Drug Use: No  . Sexual Activity:    Other Topics Concern  . Not on file   Social History Narrative  . No narrative on file  ROS:  General: Complains of anorexia and weight loss, fatigue. No chills or fever. Eyes: Negative for vision changes.  ENT: Negative for hoarseness, difficulty swallowing , nasal congestion. CV: Negative for chest pain, angina, palpitations, dyspnea on exertion, peripheral edema.  Respiratory: Negative for dyspnea at rest, dyspnea on exertion, cough, sputum, wheezing.  GI: See history of present illness. GU:  Negative for dysuria, hematuria, urinary incontinence, urinary frequency, nocturnal urination.  MS: Negative for joint pain, low back pain.  Derm: Negative for rash or itching. Complains of jaundice Neuro: Negative for weakness, abnormal sensation, seizure, frequent headaches, memory loss, confusion.  Psych: Negative for anxiety, depression, suicidal ideation, hallucinations.  Endo: See history of present illness Heme: Negative for bruising or bleeding. Allergy: Negative for rash or hives.    Physical Examination:  BP 112/75  Pulse 77  Temp(Src)  97.4 F (36.3 C) (Oral)  Ht 5\' 9"  (1.753 m)  Wt 210 lb 6.4 oz (95.437 kg)  BMI 31.06 kg/m2   General: Well developed, no acute distress. Appears fatigued.   Head: Normocephalic, atraumatic.   Eyes: Conjunctiva pink, positive scleral icterus  Mouth: Oropharyngeal mucosa moist and pink , no lesions erythema or exudate. Neck: Supple without thyromegaly, masses, or lymphadenopathy.  Lungs: Clear to auscultation bilaterally.  Heart: Regular rate and rhythm, no murmurs rubs or gallops.  Abdomen: Bowel sounds are normal, nontender, nondistended, no hepatosplenomegaly or masses, no abdominal bruits or    hernia , no rebound or guarding.   Rectal: Not performed Extremities: No lower extremity edema. No clubbing or deformities.  Neuro: Alert and oriented x 4 , grossly normal neurologically.  Skin: Warm and dry, no rash. Positive jaundice.   Psych: Alert and cooperative, normal mood and affect.  Labs: Labs from 07/24/2013 Total bilirubin 11.7, alkaline phosphatase 463, AST 252, ALT 281, albumin 3.3, calcium 9.2, GGT 1665, creatinine 0.59, glucose 116, white blood cell count 8200, hemoglobin 14.9, platelets 169,000  Imaging Studies: US Abdomen Complete  07/27/2013   CLINICAL DATA:  Jaundice, weight loss  EXAM: ULTRASOUND ABDOMEN COMPLETE  COMPARISON:  DG ABDOMEN 1V dated 06/05/2011; CT ABD/PELVIS W CM dated 04/20/2011  FINDINGS: Gallbladder:  Surgically absent.  Common bile duct:  Diameter: 32 mm. There is echogenic material within the common bile duct.  Liver:  No focal lesion identified. The previously demonstrated mass on the CT of the abdomen dated 04/20/2011 is not appreciated on the provided images. Within normal limits in parenchymal echogenicity.  IVC:  No abnormality visualized.  Pancreas:  Visualized portion unremarkable.  Spleen:  Size within normal limits.  Right Kidney:  Length: 11 cm. Normal echogenicity. There is a 1.5 cm anechoic lower pole renal mass most consistent with a cyst. There  are small echogenic foci within the right kidney likely representing nonobstructing nephrolithiasis.  Left Kidney:  Length: 12 cm. Normal echogenicity. There is a 9 mm anechoic lower pole renal mass most consistent with a cyst. There are small echogenic foci within the left kidney likely representing nonobstructing nephrolithiasis.  Abdominal aorta:  No aneurysm visualized.  Other findings:  None.  IMPRESSION: 1. Dilated common bile duct measuring 3.2 cm with echogenic material within the distal common bile duct which may reflect blood clot versus noncalcified gallstone. Recommend further evaluation with MRCP.   Electronically Signed   By: Kathreen Devoid   On: 07/27/2013 13:44

## 2013-08-03 NOTE — Interval H&P Note (Signed)
History and Physical Interval Note:  08/03/2013 11:13 AM  Bryce Daugherty  has presented today for surgery, with the diagnosis of CBD DILATION JAUNDICE, WEIGHT LOSS  The various methods of treatment have been discussed with the patient and family. After consideration of risks, benefits and other options for treatment, the patient has consented to  Procedure(s) with comments: ENDOSCOPIC RETROGRADE CHOLANGIOPANCREATOGRAPHY (ERCP) (N/A) - 8:30 SPHINCTEROTOMY (N/A) - WITH BILE DUCT BRUSHINGS BILIARY STENT PLACEMENT (N/A) REMOVAL OF STONES (N/A) as a surgical intervention .  The patient's history has been reviewed, patient examined, no change in status, stable for surgery.  I have reviewed the patient's chart and labs.  Questions were answered to the patient's satisfaction.     Bryce Daugherty   Patient now more jaundiced. Bilirubin 26 today. ERCP with biliary decompression per plan today.The risks, benefits, limitations, alternatives, and mponderable have been reviewed with the patient. I specifically discussed a1 in 10 chance of pancreatitis, reaction to medications, bleeding, perforation and the possibility of a failed ERCP. Potential for sphincterotomy and stent placement also reviewed. Questions have been answered. All parties agreeable.

## 2013-08-04 ENCOUNTER — Other Ambulatory Visit (HOSPITAL_COMMUNITY): Payer: Self-pay | Admitting: Pulmonary Disease

## 2013-08-04 ENCOUNTER — Telehealth: Payer: Self-pay | Admitting: Gastroenterology

## 2013-08-04 DIAGNOSIS — R229 Localized swelling, mass and lump, unspecified: Principal | ICD-10-CM

## 2013-08-04 DIAGNOSIS — R17 Unspecified jaundice: Secondary | ICD-10-CM

## 2013-08-04 DIAGNOSIS — R7989 Other specified abnormal findings of blood chemistry: Secondary | ICD-10-CM

## 2013-08-04 DIAGNOSIS — IMO0002 Reserved for concepts with insufficient information to code with codable children: Secondary | ICD-10-CM

## 2013-08-04 LAB — CBC WITH DIFFERENTIAL/PLATELET
Basophils Absolute: 0 10*3/uL (ref 0.0–0.1)
Basophils Relative: 0 % (ref 0–1)
Eosinophils Absolute: 0.1 10*3/uL (ref 0.0–0.7)
Eosinophils Relative: 1 % (ref 0–5)
HEMATOCRIT: 38.3 % — AB (ref 39.0–52.0)
HEMOGLOBIN: 12.8 g/dL — AB (ref 13.0–17.0)
LYMPHS ABS: 1.3 10*3/uL (ref 0.7–4.0)
LYMPHS PCT: 14 % (ref 12–46)
MCH: 30.8 pg (ref 26.0–34.0)
MCHC: 33.4 g/dL (ref 30.0–36.0)
MCV: 92.3 fL (ref 78.0–100.0)
MONO ABS: 0.7 10*3/uL (ref 0.1–1.0)
Monocytes Relative: 7 % (ref 3–12)
Neutro Abs: 7.2 10*3/uL (ref 1.7–7.7)
Neutrophils Relative %: 78 % — ABNORMAL HIGH (ref 43–77)
Platelets: 168 10*3/uL (ref 150–400)
RBC: 4.15 MIL/uL — AB (ref 4.22–5.81)
RDW: 17.5 % — ABNORMAL HIGH (ref 11.5–15.5)
WBC: 9.3 10*3/uL (ref 4.0–10.5)

## 2013-08-04 LAB — BASIC METABOLIC PANEL
BUN: 19 mg/dL (ref 6–23)
CO2: 23 mEq/L (ref 19–32)
Calcium: 9 mg/dL (ref 8.4–10.5)
Chloride: 102 mEq/L (ref 96–112)
Creatinine, Ser: 0.64 mg/dL (ref 0.50–1.35)
GFR calc Af Amer: >90 mL/min (ref >90)
GFR calc non Af Amer: >90 mL/min (ref >90)
GLUCOSE: 136 mg/dL — AB (ref 70–99)
Potassium: 4.2 mEq/L (ref 3.7–5.3)
Sodium: 137 mEq/L (ref 137–147)

## 2013-08-04 LAB — HEPATIC FUNCTION PANEL
ALT: 222 U/L — AB (ref 0–53)
AST: 201 U/L — ABNORMAL HIGH (ref 0–37)
Albumin: 2.3 g/dL — ABNORMAL LOW (ref 3.5–5.2)
Alkaline Phosphatase: 380 U/L — ABNORMAL HIGH (ref 39–117)
Bilirubin, Direct: 10.6 mg/dL — ABNORMAL HIGH (ref 0.0–0.3)
Indirect Bilirubin: 4.9 mg/dL — ABNORMAL HIGH (ref 0.3–0.9)
TOTAL PROTEIN: 5.4 g/dL — AB (ref 6.0–8.3)
Total Bilirubin: 15.5 mg/dL — ABNORMAL HIGH (ref 0.3–1.2)

## 2013-08-04 LAB — HEMOGLOBIN A1C
Hgb A1c MFr Bld: 6 % — ABNORMAL HIGH (ref <5.7)
Mean Plasma Glucose: 126 mg/dL — ABNORMAL HIGH (ref <117)

## 2013-08-04 LAB — GLUCOSE, CAPILLARY: Glucose-Capillary: 133 mg/dL — ABNORMAL HIGH (ref 70–99)

## 2013-08-04 NOTE — Progress Notes (Signed)
Discussed cytology with Dr. Saralyn Pilar. 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  Special stains pending. I have informed Dr. Luan Pulling this morning. He is going to inform the patient. I recommend a pancreatic protocol CT scan next week. Oncology consultation. From a GI standpoint, further evaluation and management can be accomplished as an outpatient.

## 2013-08-04 NOTE — Progress Notes (Signed)
Subjective:  Feels better. Denies abdominal pain. No vomiting. Tolerated clear liquids yesterday.  Objective: Vital signs in last 24 hours: Temp:  [97.5 F (36.4 C)-98.1 F (36.7 C)] 98.1 F (36.7 C) (04/17 0423) Pulse Rate:  [53-72] 54 (04/17 0423) Resp:  [11-31] 18 (04/17 0423) BP: (93-130)/(58-78) 123/71 mmHg (04/17 0423) SpO2:  [94 %-100 %] 97 % (04/17 0423) Weight:  [209 lb 3.2 oz (94.892 kg)-213 lb 11.2 oz (96.934 kg)] 213 lb 11.2 oz (96.934 kg) (04/17 0423) Last BM Date: 08/01/13 General:   Alert,   pleasant and cooperative in NAD. Wife at bedside. Head:  Normocephalic and atraumatic. Eyes:  Sclera clear, + icterus.   Abdomen:  Soft, nontender and nondistended. Normal bowel sounds, without guarding, and without rebound.   Extremities:  Without clubbing, deformity or edema. Neurologic:  Alert and  oriented x4;  grossly normal neurologically. Skin:  Intact without significant lesions or rashes. +jaundice Psych:  Alert and cooperative. Normal mood and affect.  Intake/Output from previous day: 04/16 0701 - 04/17 0700 In: 1930 [P.O.:630; I.V.:1300] Out: 3 [Urine:2; Stool:1] Intake/Output this shift:    Lab Results: CBC  Recent Labs  08/04/13 0533  WBC 9.3  HGB 12.8*  HCT 38.3*  MCV 92.3  PLT 168   BMET  Recent Labs  08/04/13 0533  NA 137  K 4.2  CL 102  CO2 23  GLUCOSE 136*  BUN 19  CREATININE 0.64  CALCIUM 9.0   LFTs  Recent Labs  08/03/13 0940 08/04/13 0533  BILITOT 26.5* 15.5*  BILIDIR 18.9* 10.6*  IBILI 7.6* 4.9*  ALKPHOS 522* 380*  AST 312* 201*  ALT 299* 222*  PROT 6.0 5.4*  ALBUMIN 2.6* 2.3*   No results found for this basename: LIPASE,  in the last 72 hours PT/INR No results found for this basename: LABPROT, INR,  in the last 72 hours    Imaging Studies: US Abdomen Complete  07/27/2013   CLINICAL DATA:  Jaundice, weight loss  EXAM: ULTRASOUND ABDOMEN COMPLETE  COMPARISON:  DG ABDOMEN 1V dated 06/05/2011; CT ABD/PELVIS W CM dated  04/20/2011  FINDINGS: Gallbladder:  Surgically absent.  Common bile duct:  Diameter: 32 mm. There is echogenic material within the common bile duct.  Liver:  No focal lesion identified. The previously demonstrated mass on the CT of the abdomen dated 04/20/2011 is not appreciated on the provided images. Within normal limits in parenchymal echogenicity.  IVC:  No abnormality visualized.  Pancreas:  Visualized portion unremarkable.  Spleen:  Size within normal limits.  Right Kidney:  Length: 11 cm. Normal echogenicity. There is a 1.5 cm anechoic lower pole renal mass most consistent with a cyst. There are small echogenic foci within the right kidney likely representing nonobstructing nephrolithiasis.  Left Kidney:  Length: 12 cm. Normal echogenicity. There is a 9 mm anechoic lower pole renal mass most consistent with a cyst. There are small echogenic foci within the left kidney likely representing nonobstructing nephrolithiasis.  Abdominal aorta:  No aneurysm visualized.  Other findings:  None.  IMPRESSION: 1. Dilated common bile duct measuring 3.2 cm with echogenic material within the distal common bile duct which may reflect blood clot versus noncalcified gallstone. Recommend further evaluation with MRCP.   Electronically Signed   By: Kathreen Devoid   On: 07/27/2013 13:44   Dg Ercp Biliary & Pancreatic Ducts  08/03/2013   CLINICAL DATA:  Elevated LFTs, biliary dilatation, jaundice, weight loss  EXAM: ERCP WITH SPHINCTEROTOMY  TECHNIQUE: Multiple spot images obtained  with the fluoroscopic device and submitted for interpretation post-procedure.  COMPARISON:  Ultrasound abdomen 07/27/2013  FINDINGS: Submitted series demonstrate the intrahepatic and extrahepatic biliary dilatation.  Multiple filling defects are seen within the distal CBD and at the confluence of the right and left hepatic ducts compatible with stones and debris.  With passage of a balloon catheter, multiple stones were retrieved as well as sludge and  debris.  No areas of wall irregularity or definite ductal narrowing identified.  Surgical clips are present from prior cholecystectomy.  CBD stent placed.  IMPRESSION: Multiple filling defects within a dilated biliary tree compatible with retained stones, sludge and debris, which were extracted during the procedure.  These images were submitted for radiologic interpretation only. Please see the procedural report for the amount of contrast and the fluoroscopy time utilized.   Electronically Signed   By: Lavonia Dana M.D.   On: 08/03/2013 17:04  [2 weeks]   Assessment: Painless jaundice, s/p ERCP with biliary sphincterotomy, sphincterotomy balloon dilation, balloon stone extraction and plastic stent placement. Significant change in bilirubin and improvement of other LFT parameters overnight. Clinically appears improved, no abdominal pain post-procedure.   Plan: F/U cytology/pathology. Stable for discharge from GI standpoint. Will advance to low-fat diet.   LOS: 1 day   Mahala Menghini  08/04/2013, 7:48 AM

## 2013-08-04 NOTE — Discharge Planning (Addendum)
Pt ready to go home and stated he had no pain.  Pt's IV removed.  Pt educated on follow up test and appointment needed.  Will all be set up through Dr. Luan Pulling office.  Pt will be wheeled to car by PCT and family when ready.

## 2013-08-04 NOTE — Telephone Encounter (Signed)
Patient currently inpatient with likely discharge. Please call Monday, and schedule CT pancreatic protocol and  ASAP oncology consultation. 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  PATIENT HAS NOT BEEN NOTIFIED OF FINDINGS YET, PLEASE WAIT UNTIL MONDAY TO ARRANGE. DR. Luan Pulling TO TALK WITH PATIENT TODAY.

## 2013-08-05 NOTE — Discharge Summary (Signed)
Physician Discharge Summary  Patient ID: Bryce Daugherty MRN: [id number] DOB/AGE: Aug 12, 1945 68 y.o. 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 **patient holding due to procedure**     atenolol 25 MG tablet  Commonly known as:  TENORMIN  Take 25 mg by mouth daily before breakfast.     doxazosin 8 MG tablet  Commonly known as:  CARDURA  Take 4 mg by mouth daily before breakfast.     glipiZIDE 5 MG tablet  Commonly known as:  GLUCOTROL  Take 5 mg by mouth daily before breakfast.     levofloxacin 500 MG tablet  Commonly known as:  LEVAQUIN  Take 500 mg by mouth daily.     metFORMIN 500 MG tablet  Commonly known as:  GLUCOPHAGE  Take 500 mg by mouth daily before breakfast.        Discharged Condition: Improved    Consults: GI  Significant Diagnostic Studies: US Abdomen Complete  07/27/2013   CLINICAL DATA:  Jaundice, weight loss  EXAM: ULTRASOUND ABDOMEN COMPLETE  COMPARISON:  DG ABDOMEN 1V dated 06/05/2011; CT ABD/PELVIS W CM dated 04/20/2011  FINDINGS: Gallbladder:  Surgically absent.  Common bile duct:  Diameter: 32 mm. There is echogenic material within the common bile duct.  Liver:  No focal lesion identified. The previously demonstrated mass on the CT of the abdomen dated 04/20/2011 is not appreciated on the provided images. Within normal limits in parenchymal echogenicity.  IVC:  No abnormality visualized.  Pancreas:  Visualized portion unremarkable.  Spleen:  Size within normal limits.  Right Kidney:  Length: 11 cm. Normal echogenicity. There is a 1.5 cm anechoic  lower pole renal mass most consistent with a cyst. There are small echogenic foci within the right kidney likely representing nonobstructing nephrolithiasis.  Left Kidney:  Length: 12 cm. Normal echogenicity. There is a 9 mm anechoic lower pole renal mass most consistent with a cyst. There are small echogenic foci within the left kidney likely representing nonobstructing nephrolithiasis.  Abdominal aorta:  No aneurysm visualized.  Other findings:  None.  IMPRESSION: 1. Dilated common bile duct measuring 3.2 cm with echogenic material within the distal common bile duct which may reflect blood clot versus noncalcified gallstone. Recommend further evaluation with MRCP.   Electronically Signed   By: Kathreen Devoid   On: 07/27/2013 13:44   Dg Ercp Biliary & Pancreatic Ducts  08/03/2013   CLINICAL DATA:  Elevated LFTs, biliary dilatation, jaundice, weight loss  EXAM: ERCP WITH SPHINCTEROTOMY  TECHNIQUE: Multiple spot images obtained with the fluoroscopic device and submitted for interpretation post-procedure.  COMPARISON:  Ultrasound abdomen 07/27/2013  FINDINGS: Submitted series demonstrate the intrahepatic and extrahepatic biliary dilatation.  Multiple filling defects are seen within the distal CBD and at the confluence of the right and left hepatic ducts compatible with stones and debris.  With passage of a balloon catheter, multiple stones were retrieved as well as sludge and debris.  No areas of wall irregularity or definite ductal narrowing identified.  Surgical clips are present from prior cholecystectomy.  CBD stent placed.  IMPRESSION: Multiple filling defects  within a dilated biliary tree compatible with retained stones, sludge and debris, which were extracted during the procedure.  These images were submitted for radiologic interpretation only. Please see the procedural report for the amount of contrast and the fluoroscopy time utilized.   Electronically Signed   By: Lavonia Dana M.D.   On: 08/03/2013 17:04     Lab Results: Basic Metabolic Panel:  Recent Labs  08/04/13 0533  NA 137  K 4.2  CL 102  CO2 23  GLUCOSE 136*  BUN 19  CREATININE 0.64  CALCIUM 9.0   Liver Function Tests:  Recent Labs  08/03/13 0940 08/04/13 0533  AST 312* 201*  ALT 299* 222*  ALKPHOS 522* 380*  BILITOT 26.5* 15.5*  PROT 6.0 5.4*  ALBUMIN 2.6* 2.3*     CBC:  Recent Labs  08/04/13 0533  WBC 9.3  NEUTROABS 7.2  HGB 12.8*  HCT 38.3*  MCV 92.3  PLT 168    No results found for this or any previous visit (from the past 240 hour(s)).   Hospital Course: This is a 68 year old who came to my office after he was discovered at the [place] clinic to have jaundice. He generally follows at the [place] but because he was going to have to go to run a [place] to have any further testing done he came to my office to arrange testing. He was noted to be jaundiced at that time. He underwent abdominal ultrasound and this showed that his bile duct was dilated. He was treated with Levaquin to prevent acute cholangitis. GI consultation was obtained and he had stent placement as an outpatient but because the stent was difficult to place he was brought in for observation overnight. The next morning his bilirubin had dropped by about 10 points he had no pain and felt much better. 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  Discharge Exam: Blood pressure 123/71, pulse 54, temperature 98.1 F (36.7 C), temperature source Oral, resp. rate 18, height 5\' 9"  (1.753 m), weight 96.934 kg (213 lb 11.2 oz), SpO2 97.00%. He is awake and alert. He is still jaundiced but better. He has no abdominal pain  Disposition: Home he will have laboratory work in 3 days and will be set up for pancreatic protocol CT and followup in the oncology clinic       Future Appointments Provider Department Dept Phone   08/07/2013 3:15 PM  [facility] [address] [facility] [phone]   Please pick up your oral contrast prep at your scheduled location at least 1 day prior to your appointment. Liquids only 4 hours prior to your exam. Any medications can be taken as usual. Please arrive 15 min prior to your scheduled exam time.   08/18/2013 3:30 PM [facility] Provider [place] [phone]        Signed: [name]   08/05/2013, 9:43 AM

## 2013-08-07 ENCOUNTER — Ambulatory Visit (HOSPITAL_COMMUNITY)
Admission: RE | Admit: 2013-08-07 | Discharge: 2013-08-07 | Disposition: A | Discharge status: Home / Self Care | Payer: 59 | Source: Ambulatory Visit | Attending: Gastroenterology | Admitting: Gastroenterology

## 2013-08-07 ENCOUNTER — Ambulatory Visit (HOSPITAL_COMMUNITY)
Admission: RE | Admit: 2013-08-07 | Discharge: 2013-08-07 | Disposition: A | Discharge status: Home / Self Care | Payer: 59 | Source: Ambulatory Visit | Attending: Pulmonary Disease | Admitting: Pulmonary Disease

## 2013-08-07 ENCOUNTER — Other Ambulatory Visit: Payer: Self-pay | Admitting: Gastroenterology

## 2013-08-07 ENCOUNTER — Encounter (HOSPITAL_COMMUNITY): Payer: Self-pay | Admitting: Internal Medicine

## 2013-08-07 DIAGNOSIS — K839 Disease of biliary tract, unspecified: Secondary | ICD-10-CM

## 2013-08-07 DIAGNOSIS — IMO0002 Reserved for concepts with insufficient information to code with codable children: Secondary | ICD-10-CM

## 2013-08-07 DIAGNOSIS — R229 Localized swelling, mass and lump, unspecified: Principal | ICD-10-CM

## 2013-08-07 DIAGNOSIS — R17 Unspecified jaundice: Secondary | ICD-10-CM | POA: Insufficient documentation

## 2013-08-07 MED ORDER — IOHEXOL 300 MG/ML  SOLN
100.0000 mL | Freq: Once | INTRAMUSCULAR | Status: AC | PRN
  Administered 2013-08-07: 100 mL via INTRAVENOUS

## 2013-08-07 NOTE — Telephone Encounter (Signed)
Patient is in Radiology now for CT

## 2013-08-07 NOTE — Telephone Encounter (Signed)
Per Magda Paganini, OK to route to Darius Bump to schedule the CT. ( Please speak with me first, Darius Bump) Thanks!

## 2013-08-07 NOTE — Telephone Encounter (Signed)
This is a [facility] pt. Routing to DS

## 2013-08-08 ENCOUNTER — Telehealth: Payer: Self-pay

## 2013-08-08 ENCOUNTER — Telehealth: Payer: Self-pay | Admitting: Gastroenterology

## 2013-08-08 ENCOUNTER — Telehealth: Payer: Self-pay | Admitting: Internal Medicine

## 2013-08-08 DIAGNOSIS — R17 Unspecified jaundice: Secondary | ICD-10-CM

## 2013-08-08 LAB — HEPATIC FUNCTION PANEL
ALBUMIN: 3.1 g/dL — AB (ref 3.5–5.2)
ALT: 157 U/L — AB (ref 0–53)
AST: 140 U/L — ABNORMAL HIGH (ref 0–37)
Alkaline Phosphatase: 342 U/L — ABNORMAL HIGH (ref 39–117)
BILIRUBIN DIRECT: 4.8 mg/dL — AB (ref 0.0–0.3)
Indirect Bilirubin: 4.6 mg/dL — ABNORMAL HIGH (ref 0.2–1.2)
Total Bilirubin: 9.4 mg/dL — ABNORMAL HIGH (ref 0.2–1.2)
Total Protein: 5.5 g/dL — ABNORMAL LOW (ref 6.0–8.3)

## 2013-08-08 NOTE — Telephone Encounter (Signed)
T/C from Bryce Daugherty at [facility] Radiology with results of the CT of abd and pelvis. The results are in the chart, and she said Impression #1 is what they wanted to focus in on.   I verbally told Neil Crouch, PA .

## 2013-08-08 NOTE — Telephone Encounter (Signed)
Patient needs to have LFTs done in next 24 hours.

## 2013-08-08 NOTE — Telephone Encounter (Deleted)
Opened in error

## 2013-08-08 NOTE — Telephone Encounter (Signed)
Patient may have had done with Dr. Luan Pulling yesterday. Had labs drawn at 3pm yesterday. Ginger has called to see what was ordered. Please check with [facility] and see if we can add LFTs if not done.

## 2013-08-08 NOTE — Telephone Encounter (Signed)
I talked with [facility] and they are going to add the LFT's to the labs done at Dr. Luan Pulling office Monday.

## 2013-08-08 NOTE — Telephone Encounter (Signed)
Dr. 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 Has been reviewed by multiple pathologists. Still has a couple more stains to complete. If they are unrevealing, slides will be sent out to [facility] for extramural consultation with one of their cytopathologists.

## 2013-08-08 NOTE — Telephone Encounter (Signed)
CT findings reviewed. I called patient on the phone and discussed CT findings. Final stains on cytology remain pending. It is not clear to me at this time whether patient is at all operative candidate or not. Has an oncology appointment May 1. Special stains on cytology remain pending. He may benefit from further staging via EUS/MRI/MRCP. However, we'll see what the oncologist has to say before pursuing further imaging. I informed the patient that he will need a stent change in about 2-3 months. LFTs drawn through Dr. Luan Pulling office yesterday remain pending.

## 2013-08-09 ENCOUNTER — Telehealth: Payer: Self-pay

## 2013-08-09 NOTE — Telephone Encounter (Signed)
Bryce Daugherty from [place] called to get information on him for his disability for [employer]. I am going to fax over the operative report for they to [phone].

## 2013-08-14 ENCOUNTER — Other Ambulatory Visit: Payer: Self-pay

## 2013-08-14 ENCOUNTER — Other Ambulatory Visit: Payer: Self-pay | Admitting: Gastroenterology

## 2013-08-14 DIAGNOSIS — R17 Unspecified jaundice: Secondary | ICD-10-CM

## 2013-08-15 LAB — HEPATIC FUNCTION PANEL
ALT: 80 U/L — ABNORMAL HIGH (ref 0–53)
AST: 72 U/L — ABNORMAL HIGH (ref 0–37)
Albumin: 3.3 g/dL — ABNORMAL LOW (ref 3.5–5.2)
Alkaline Phosphatase: 231 U/L — ABNORMAL HIGH (ref 39–117)
BILIRUBIN TOTAL: 5.6 mg/dL — AB (ref 0.2–1.2)
Bilirubin, Direct: 2.5 mg/dL — ABNORMAL HIGH (ref 0.0–0.3)
Indirect Bilirubin: 3.1 mg/dL — ABNORMAL HIGH (ref 0.2–1.2)
Total Protein: 6 g/dL (ref 6.0–8.3)

## 2013-08-16 NOTE — Progress Notes (Signed)
Quick Note:  LFTs improved. Appt with oncology on Friday.  Route to Dr. Gala Romney (WHEN DO YOU WANT NEXT LFTS?) ______

## 2013-08-17 ENCOUNTER — Encounter (HOSPITAL_COMMUNITY): Payer: 59 | Attending: Hematology and Oncology

## 2013-08-17 ENCOUNTER — Other Ambulatory Visit (HOSPITAL_COMMUNITY): Payer: Self-pay | Admitting: Hematology and Oncology

## 2013-08-17 ENCOUNTER — Telehealth: Payer: Self-pay | Admitting: Internal Medicine

## 2013-08-17 ENCOUNTER — Encounter (HOSPITAL_COMMUNITY): Payer: Self-pay

## 2013-08-17 ENCOUNTER — Encounter (HOSPITAL_BASED_OUTPATIENT_CLINIC_OR_DEPARTMENT_OTHER): Payer: 59

## 2013-08-17 VITALS — BP 130/79 | HR 86 | Temp 97.8°F | Resp 16 | Ht 68.25 in | Wt 209.9 lb

## 2013-08-17 DIAGNOSIS — I10 Essential (primary) hypertension: Secondary | ICD-10-CM

## 2013-08-17 DIAGNOSIS — I129 Hypertensive chronic kidney disease with stage 1 through stage 4 chronic kidney disease, or unspecified chronic kidney disease: Secondary | ICD-10-CM | POA: Insufficient documentation

## 2013-08-17 DIAGNOSIS — C240 Malignant neoplasm of extrahepatic bile duct: Secondary | ICD-10-CM | POA: Insufficient documentation

## 2013-08-17 DIAGNOSIS — R599 Enlarged lymph nodes, unspecified: Secondary | ICD-10-CM | POA: Insufficient documentation

## 2013-08-17 DIAGNOSIS — C221 Intrahepatic bile duct carcinoma: Secondary | ICD-10-CM

## 2013-08-17 DIAGNOSIS — Z9189 Other specified personal risk factors, not elsewhere classified: Secondary | ICD-10-CM | POA: Insufficient documentation

## 2013-08-17 DIAGNOSIS — E119 Type 2 diabetes mellitus without complications: Secondary | ICD-10-CM | POA: Insufficient documentation

## 2013-08-17 DIAGNOSIS — Z87891 Personal history of nicotine dependence: Secondary | ICD-10-CM | POA: Insufficient documentation

## 2013-08-17 DIAGNOSIS — K831 Obstruction of bile duct: Secondary | ICD-10-CM

## 2013-08-17 DIAGNOSIS — N189 Chronic kidney disease, unspecified: Secondary | ICD-10-CM | POA: Insufficient documentation

## 2013-08-17 DIAGNOSIS — K3184 Gastroparesis: Secondary | ICD-10-CM | POA: Insufficient documentation

## 2013-08-17 LAB — CBC WITH DIFFERENTIAL/PLATELET
Basophils Absolute: 0 10*3/uL (ref 0.0–0.1)
Basophils Relative: 0 % (ref 0–1)
EOS ABS: 0.1 10*3/uL (ref 0.0–0.7)
EOS PCT: 1 % (ref 0–5)
HEMATOCRIT: 39.5 % (ref 39.0–52.0)
Hemoglobin: 13.3 g/dL (ref 13.0–17.0)
LYMPHS ABS: 1.4 10*3/uL (ref 0.7–4.0)
Lymphocytes Relative: 17 % (ref 12–46)
MCH: 31.6 pg (ref 26.0–34.0)
MCHC: 33.7 g/dL (ref 30.0–36.0)
MCV: 93.8 fL (ref 78.0–100.0)
MONO ABS: 0.6 10*3/uL (ref 0.1–1.0)
Monocytes Relative: 7 % (ref 3–12)
Neutro Abs: 6 10*3/uL (ref 1.7–7.7)
Neutrophils Relative %: 75 % (ref 43–77)
Platelets: 155 10*3/uL (ref 150–400)
RBC: 4.21 MIL/uL — AB (ref 4.22–5.81)
RDW: 16.8 % — AB (ref 11.5–15.5)
WBC: 8 10*3/uL (ref 4.0–10.5)

## 2013-08-17 LAB — COMPREHENSIVE METABOLIC PANEL
ALT: 72 U/L — AB (ref 0–53)
AST: 72 U/L — ABNORMAL HIGH (ref 0–37)
Albumin: 2.8 g/dL — ABNORMAL LOW (ref 3.5–5.2)
Alkaline Phosphatase: 238 U/L — ABNORMAL HIGH (ref 39–117)
BUN: 16 mg/dL (ref 6–23)
CALCIUM: 9.3 mg/dL (ref 8.4–10.5)
CO2: 24 meq/L (ref 19–32)
CREATININE: 0.75 mg/dL (ref 0.50–1.35)
Chloride: 102 mEq/L (ref 96–112)
GLUCOSE: 211 mg/dL — AB (ref 70–99)
Potassium: 3.9 mEq/L (ref 3.7–5.3)
Sodium: 138 mEq/L (ref 137–147)
TOTAL PROTEIN: 6.4 g/dL (ref 6.0–8.3)
Total Bilirubin: 3.9 mg/dL — ABNORMAL HIGH (ref 0.3–1.2)

## 2013-08-17 LAB — LACTATE DEHYDROGENASE: LDH: 152 U/L (ref 94–250)

## 2013-08-17 MED ORDER — METOCLOPRAMIDE HCL 5 MG PO TABS: ORAL_TABLET | ORAL | Status: DC

## 2013-08-17 NOTE — Progress Notes (Signed)
[facility], M.D.  NEW PATIENT EVALUATION   Name: Bryce Daugherty Date: 08/18/2013 MRN: [id number] DOB: Nov 08, 1945  PCP: [name], MD   REFERRING PHYSICIAN: [name], MD  REASON FOR REFERRAL: Cholangiocarcinoma with peri-portal lymphadenopathy.     HISTORY OF PRESENT ILLNESS:Bryce Daugherty is a 68 y.o. male who is referred for evaluation and recommendations regarding the management of cholangiocarcinoma presenting with biliary tract obstruction, status post stenting with evidence of periportal lymphadenopathy on CT scan. He had lost about 12-13 pounds over one month. Associated with abdominal discomfort, nausea, diarrhea, and yellow discoloration of his skin with lightening of his stool and darkening of his urine. He denies any significant pruritus. Pain is a right upper quadrant radiating to the back. He was evaluated and found to have a common bile duct stone which was removed by sphincterotomy and a biliary stent was inserted. Brushings from the procedure were positive for adenocarcinoma consistent with a cholangiocarcinoma primary. 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 Since undergoing sphincterotomy and stent placement, the patient's symptoms have improved with decreased diarrhea and decreased pain. He is eating better. He denies any fever, night sweats, but still has loose bowel movements without melena, hematochezia, hematuria, lower extremity swelling or redness, cough, wheezing, skin rash, pruritus, headache, or seizures. He experiences episodes of bloating and belching associated with abdominal swelling. The patient spent one year in [place] war in [place] in the site on area and had exposure to agent orange. 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 He is here today with his brother, wife, and daughter for discussion of treatment options.   PAST MEDICAL HISTORY:  has a past medical history of Hypertension; Chronic kidney disease; Arthritis; Kidney stones; and Diabetes mellitus.     PAST SURGICAL HISTORY: Past Surgical History  Procedure Laterality Date  . Cystectomy  under left arm  . Colonoscopy  03/24/2011    [id number] hemorrhoids  . Cholecystectomy      1984  . Catarac both eyes    . Tonsillectomy    . Ercp N/A 08/03/2013    Procedure: ENDOSCOPIC RETROGRADE CHOLANGIOPANCREATOGRAPHY (ERCP);  Surgeon: Daneil Dolin, MD;  Location: [facility];  Service: Endoscopy;  Laterality: N/A;  . Sphincterotomy N/A 08/03/2013    Procedure: SPHINCTEROTOMY;  Surgeon: Daneil Dolin, MD;  Location: [facility];  Service: Endoscopy;  Laterality: N/A;  with dilation   . Biliary stent placement N/A 08/03/2013    Procedure: BILIARY STENT PLACEMENT;  Surgeon: Daneil Dolin, MD;  Location: [facility];  Service: Endoscopy;  Laterality: N/A;  . Removal of stones N/A 08/03/2013    Procedure: REMOVAL OF STONES;  Surgeon: Daneil Dolin, MD;  Location: [facility];  Service: Endoscopy;  Laterality: N/A;     CURRENT MEDICATIONS: has a current medication list which includes the following prescription(s): aspirin ec, atenolol, doxazosin, glipizide, metformin, and metoclopramide.   ALLERGIES: Review of patient's allergies indicates no known allergies.   SOCIAL HISTORY:  reports that he quit smoking about 25 years ago. His smoking use included Cigarettes. He has a 20 pack-year smoking history. He has never used smokeless tobacco. He reports that he does not drink alcohol or use illicit drugs.  FAMILY HISTORY: family history includes  Heart attack in his father; Pancreatic cancer in his mother. There is no history of Colon cancer.    REVIEW OF SYSTEMS:  Other than that discussed above is noncontributory.    PHYSICAL  EXAM:  height is 5' 8.25" (1.734 m) and weight is 209 lb 14.4 oz (95.21 kg). His oral temperature is 97.8 F (36.6 C). His blood pressure is 130/79 and his pulse is 86. His respiration is 16 and oxygen saturation is 98%.    GENERAL:alert, no distress and comfortable SKIN: skin color, texture, turgor are normal, no rashes or significant lesions EYES: normal, Conjunctiva are pink and non-injected, sclera  icteric. OROPHARYNX:no exudate, no erythema and lips, buccal mucosa, and tongue normal  NECK: supple, thyroid normal size, non-tender, without nodularity CHEST: Normal AP diameter with no gynecomastia. LYMPH:  no palpable lymphadenopathy in the cervical, axillary or inguinal LUNGS: clear to auscultation and percussion with normal breathing effort HEART: regular rate & rhythm and no murmurs ABDOMEN:abdomen soft, non-tender and normal bowel sounds. No right upper quadrant tenderness. No CVA tenderness. MUSCULOSKELETALl:no cyanosis of digits, no clubbing or edema  NEURO: alert & oriented x 3 with fluent speech, no focal motor/sensory deficits    LABORATORY DATA:  Office Visit on 08/17/2013  Component Date Value Ref Range Status  . WBC 08/17/2013 8.0  4.0 - 10.5 K/uL Final  . RBC 08/17/2013 4.21* 4.22 - 5.81 MIL/uL Final  . Hemoglobin 08/17/2013 13.3  13.0 - 17.0 g/dL Final  . HCT 08/17/2013 39.5  39.0 - 52.0 % Final  . MCV 08/17/2013 93.8  78.0 - 100.0 fL Final  . MCH 08/17/2013 31.6  26.0 - 34.0 pg Final  . MCHC 08/17/2013 33.7  30.0 - 36.0 g/dL Final  . RDW 08/17/2013 16.8* 11.5 - 15.5 % Final  . Platelets 08/17/2013 155  150 - 400 K/uL Final  . Neutrophils Relative % 08/17/2013 75  43 - 77 % Final  . Neutro Abs 08/17/2013 6.0  1.7 - 7.7 K/uL Final  . Lymphocytes Relative 08/17/2013 17  12 - 46 % Final  . Lymphs Abs 08/17/2013 1.4  0.7 - 4.0 K/uL Final  . Monocytes Relative 08/17/2013 7  3 - 12 % Final  . Monocytes Absolute 08/17/2013 0.6  0.1 - 1.0 K/uL Final  . Eosinophils Relative  08/17/2013 1  0 - 5 % Final  . Eosinophils Absolute 08/17/2013 0.1  0.0 - 0.7 K/uL Final  . Basophils Relative 08/17/2013 0  0 - 1 % Final  . Basophils Absolute 08/17/2013 0.0  0.0 - 0.1 K/uL Final  . Sodium 08/17/2013 138  137 - 147 mEq/L Final  . Potassium 08/17/2013 3.9  3.7 - 5.3 mEq/L Final  . Chloride 08/17/2013 102  96 - 112 mEq/L Final  . CO2 08/17/2013 24  19 - 32 mEq/L Final  . Glucose, Bld 08/17/2013 211* 70 - 99 mg/dL Final  . BUN 08/17/2013 16  6 - 23 mg/dL Final  . Creatinine, Ser 08/17/2013 0.75  0.50 - 1.35 mg/dL Final  . Calcium 08/17/2013 9.3  8.4 - 10.5 mg/dL Final  . Total Protein 08/17/2013 6.4  6.0 - 8.3 g/dL Final  . Albumin 08/17/2013 2.8* 3.5 - 5.2 g/dL Final  . AST 08/17/2013 72* 0 - 37 U/L Final  . ALT 08/17/2013 72* 0 - 53 U/L Final  . Alkaline Phosphatase 08/17/2013 238* 39 - 117 U/L Final  . Total Bilirubin 08/17/2013 3.9* 0.3 - 1.2 mg/dL Final  . GFR calc non Af Amer 08/17/2013 >  90  >90 mL/min Final  . GFR calc Af Amer 08/17/2013 >90  >90 mL/min Final   Comment: (NOTE)                          The eGFR has been calculated using the CKD EPI equation.                          This calculation has not been validated in all clinical situations.                          eGFR's persistently <90 mL/min signify possible Chronic Kidney                          Disease.  Marland Kitchen LDH 08/17/2013 152  94 - 250 U/L Final  . CEA 08/17/2013 1.5  0.0 - 5.0 ng/mL Final   Performed at [facility]  Orders Only on 08/14/2013  Component Date Value Ref Range Status  . Total Bilirubin 08/15/2013 5.6* 0.2 - 1.2 mg/dL Final  . Bilirubin, Direct 08/15/2013 2.5* 0.0 - 0.3 mg/dL Final  . Indirect Bilirubin 08/15/2013 3.1* 0.2 - 1.2 mg/dL Final  . Alkaline Phosphatase 08/15/2013 231* 39 - 117 U/L Final  . AST 08/15/2013 72* 0 - 37 U/L Final  . ALT 08/15/2013 80* 0 - 53 U/L Final  . Total Protein 08/15/2013 6.0  6.0 - 8.3 g/dL Final  . Albumin 08/15/2013 3.3* 3.5 - 5.2 g/dL  Final  Telephone on 08/08/2013  Component Date Value Ref Range Status  . Total Bilirubin 08/08/2013 9.4* 0.2 - 1.2 mg/dL Final  . Bilirubin, Direct 08/08/2013 4.8* 0.0 - 0.3 mg/dL Final  . Indirect Bilirubin 08/08/2013 4.6* 0.2 - 1.2 mg/dL Final  . Alkaline Phosphatase 08/08/2013 342* 39 - 117 U/L Final  . AST 08/08/2013 140* 0 - 37 U/L Final  . ALT 08/08/2013 157* 0 - 53 U/L Final  . Total Protein 08/08/2013 5.5* 6.0 - 8.3 g/dL Final  . Albumin 08/08/2013 3.1* 3.5 - 5.2 g/dL Final  Admission on 08/03/2013, Discharged on 08/04/2013  Component Date Value Ref Range Status  . Total Protein 08/03/2013 6.0  6.0 - 8.3 g/dL Final   ICTERUS AT THIS LEVEL MAY AFFECT RESULT  . Albumin 08/03/2013 2.6* 3.5 - 5.2 g/dL Final  . AST 08/03/2013 312* 0 - 37 U/L Final  . ALT 08/03/2013 299* 0 - 53 U/L Final  . Alkaline Phosphatase 08/03/2013 522* 39 - 117 U/L Final  . Total Bilirubin 08/03/2013 26.5* 0.3 - 1.2 mg/dL Final   Comment: CRITICAL RESULT CALLED TO, READ BACK BY AND VERIFIED WITH:                          MYERS,T AT 10:30 AM ON 08/03/13 BY FESTERMAN,C  . Bilirubin, Direct 08/03/2013 18.9* 0.0 - 0.3 mg/dL Final  . Indirect Bilirubin 08/03/2013 7.6* 0.3 - 0.9 mg/dL Final  . Glucose-Capillary 08/03/2013 191* 70 - 99 mg/dL Final  . Glucose-Capillary 08/03/2013 172* 70 - 99 mg/dL Final  . Glucose-Capillary 08/03/2013 187* 70 - 99 mg/dL Final  . Comment 1 08/03/2013 Documented in Chart   Final  . Comment 2 08/03/2013 Notify RN   Final  . Total Protein 08/04/2013 5.4* 6.0 - 8.3 g/dL Final  . Albumin 08/04/2013 2.3* 3.5 - 5.2 g/dL  Final  . AST 08/04/2013 201* 0 - 37 U/L Final  . ALT 08/04/2013 222* 0 - 53 U/L Final  . Alkaline Phosphatase 08/04/2013 380* 39 - 117 U/L Final  . Total Bilirubin 08/04/2013 15.5* 0.3 - 1.2 mg/dL Final   DELTA CHECK NOTED  . Bilirubin, Direct 08/04/2013 10.6* 0.0 - 0.3 mg/dL Final  . Indirect Bilirubin 08/04/2013 4.9* 0.3 - 0.9 mg/dL Final  . WBC 08/04/2013 9.3  4.0  - 10.5 K/uL Final  . RBC 08/04/2013 4.15* 4.22 - 5.81 MIL/uL Final  . Hemoglobin 08/04/2013 12.8* 13.0 - 17.0 g/dL Final  . HCT 08/04/2013 38.3* 39.0 - 52.0 % Final  . MCV 08/04/2013 92.3  78.0 - 100.0 fL Final  . MCH 08/04/2013 30.8  26.0 - 34.0 pg Final  . MCHC 08/04/2013 33.4  30.0 - 36.0 g/dL Final  . RDW 08/04/2013 17.5* 11.5 - 15.5 % Final  . Platelets 08/04/2013 168  150 - 400 K/uL Final  . Neutrophils Relative % 08/04/2013 78* 43 - 77 % Final  . Neutro Abs 08/04/2013 7.2  1.7 - 7.7 K/uL Final  . Lymphocytes Relative 08/04/2013 14  12 - 46 % Final  . Lymphs Abs 08/04/2013 1.3  0.7 - 4.0 K/uL Final  . Monocytes Relative 08/04/2013 7  3 - 12 % Final  . Monocytes Absolute 08/04/2013 0.7  0.1 - 1.0 K/uL Final  . Eosinophils Relative 08/04/2013 1  0 - 5 % Final  . Eosinophils Absolute 08/04/2013 0.1  0.0 - 0.7 K/uL Final  . Basophils Relative 08/04/2013 0  0 - 1 % Final  . Basophils Absolute 08/04/2013 0.0  0.0 - 0.1 K/uL Final  . Hemoglobin A1C 08/04/2013 6.0* <5.7 % Final   Comment: (NOTE)                                                                                                                         According to the ADA Clinical Practice Recommendations for 2011, when                          HbA1c is used as a screening test:                           >=6.5%   Diagnostic of Diabetes Mellitus                                    (if abnormal result is confirmed)                          5.7-6.4%   Increased risk of developing Diabetes Mellitus                          References:Diagnosis and Classification of Diabetes Mellitus,Diabetes  XKGY,1856,31(SHFWY 1):S62-S69 and Standards of Medical Care in                                  Diabetes - 2011,Diabetes OVZC,5885,02 (Suppl 1):S11-S61.  . Mean Plasma Glucose 08/04/2013 126* <117 mg/dL Final   Performed at [facility]  . Sodium 08/04/2013 137  137 - 147 mEq/L Final  . Potassium 08/04/2013  4.2  3.7 - 5.3 mEq/L Final  . Chloride 08/04/2013 102  96 - 112 mEq/L Final  . CO2 08/04/2013 23  19 - 32 mEq/L Final  . Glucose, Bld 08/04/2013 136* 70 - 99 mg/dL Final  . BUN 08/04/2013 19  6 - 23 mg/dL Final  . Creatinine, Ser 08/04/2013 0.64  0.50 - 1.35 mg/dL Final   ICTERUS AT THIS LEVEL MAY AFFECT RESULT  . Calcium 08/04/2013 9.0  8.4 - 10.5 mg/dL Final  . GFR calc non Af Amer 08/04/2013 >90  >90 mL/min Final  . GFR calc Af Amer 08/04/2013 >90  >90 mL/min Final   Comment: (NOTE)                          The eGFR has been calculated using the CKD EPI equation.                          This calculation has not been validated in all clinical situations.                          eGFR's persistently <90 mL/min signify possible Chronic Kidney                          Disease.  . Glucose-Capillary 08/03/2013 201* 70 - 99 mg/dL Final  . Comment 1 08/03/2013 Documented in Chart   Final  . Comment 2 08/03/2013 Notify RN   Final  . Glucose-Capillary 08/04/2013 133* 70 - 99 mg/dL Final  Hospital Outpatient Visit on 07/31/2013  Component Date Value Ref Range Status  . WBC 07/31/2013 9.1  4.0 - 10.5 K/uL Final  . RBC 07/31/2013 4.68  4.22 - 5.81 MIL/uL Final  . Hemoglobin 07/31/2013 14.3  13.0 - 17.0 g/dL Final  . HCT 07/31/2013 43.0  39.0 - 52.0 % Final  . MCV 07/31/2013 91.9  78.0 - 100.0 fL Final  . MCH 07/31/2013 30.6  26.0 - 34.0 pg Final  . MCHC 07/31/2013 33.3  30.0 - 36.0 g/dL Final  . RDW 07/31/2013 16.7* 11.5 - 15.5 % Final  . Platelets 07/31/2013 208  150 - 400 K/uL Final  . Sodium 07/31/2013 138  137 - 147 mEq/L Final  . Potassium 07/31/2013 4.9  3.7 - 5.3 mEq/L Final  . Chloride 07/31/2013 100  96 - 112 mEq/L Final  . CO2 07/31/2013 22  19 - 32 mEq/L Final  . Glucose, Bld 07/31/2013 163* 70 - 99 mg/dL Final  . BUN 07/31/2013 22  6 - 23 mg/dL Final  . Creatinine, Ser 07/31/2013 0.80  0.50 - 1.35 mg/dL Final   ICTERIC SPECIMEN  . Calcium 07/31/2013 10.1  8.4 - 10.5 mg/dL  Final  . GFR calc non Af Amer 07/31/2013 >90  >90 mL/min Final  . GFR calc Af Amer 07/31/2013 >90  >90 mL/min Final   Comment: (NOTE)  The eGFR has been calculated using the CKD EPI equation.                          This calculation has not been validated in all clinical situations.                          eGFR's persistently <90 mL/min signify possible Chronic Kidney                          Disease.  . Lipase 07/31/2013 40  11 - 59 U/L Final    Urinalysis No results found for this basename: colorurine,  appearanceur,  labspec,  phurine,  glucoseu,  hgbur,  bilirubinur,  ketonesur,  proteinur,  urobilinogen,  nitrite,  leukocytesur      _0 : US Abdomen Complete  07/27/2013   CLINICAL DATA:  Jaundice, weight loss  EXAM: ULTRASOUND ABDOMEN COMPLETE  COMPARISON:  DG ABDOMEN 1V dated 06/05/2011; CT ABD/PELVIS W CM dated 04/20/2011  FINDINGS: Gallbladder:  Surgically absent.  Common bile duct:  Diameter: 32 mm. There is echogenic material within the common bile duct.  Liver:  No focal lesion identified. The previously demonstrated mass on the CT of the abdomen dated 04/20/2011 is not appreciated on the provided images. Within normal limits in parenchymal echogenicity.  IVC:  No abnormality visualized.  Pancreas:  Visualized portion unremarkable.  Spleen:  Size within normal limits.  Right Kidney:  Length: 11 cm. Normal echogenicity. There is a 1.5 cm anechoic lower pole renal mass most consistent with a cyst. There are small echogenic foci within the right kidney likely representing nonobstructing nephrolithiasis.  Left Kidney:  Length: 12 cm. Normal echogenicity. There is a 9 mm anechoic lower pole renal mass most consistent with a cyst. There are small echogenic foci within the left kidney likely representing nonobstructing nephrolithiasis.  Abdominal aorta:  No aneurysm visualized.  Other findings:  None.  IMPRESSION: 1. Dilated common bile duct measuring 3.2 cm with  echogenic material within the distal common bile duct which may reflect blood clot versus noncalcified gallstone. Recommend further evaluation with MRCP.   Electronically Signed   By: Kathreen Devoid   On: 07/27/2013 13:44   Ct Abd Wo & W Cm  08/07/2013   CLINICAL DATA:  Bile duct abnormality.  Jaundice.  EXAM: CT ABDOMEN WITHOUT AND WITH CONTRAST  TECHNIQUE: Multidetector CT imaging of the abdomen was performed following the standard protocol before and following the bolus administration of intravenous contrast.  CONTRAST:  132m OMNIPAQUE IOHEXOL 300 MG/ML  SOLN  COMPARISON:  ERCP 08/03/2013.  Abdominal pelvic CT of 04/20/2011  FINDINGS: Lower Chest: Clear lung bases. Normal heart size without pericardial or pleural effusion. LAD coronary artery atherosclerosis.  Abdomen/Pelvis: No hypervascular lesions within the liver on arterial phase images.  Hepatomegaly, 20.4 cm craniocaudal. Marked intrahepatic biliary ductal dilatation involving the high lateral segment left lobe of the liver. Interval enlargement of an ill-defined central area of left hepatic lobe hypo enhancement. This measures on the order of 6.4 x 7.6 cm. Increased from a 6.8 x 3.9 cm on 04/20/2011. 6.3 cm craniocaudal on coronal image 30/series 9 today versus 4.6 cm on the prior. Involves segments 2, 3, 4 of the liver. Immediately subjacent to the right hemidiaphragm, for which tumor involvement cannot be excluded.  The left portal vein is attenuated and likely involved with tumor. Example image 40/series  7. The main portal vein is uninvolved. A stent is identified within the central right hepatic duct, terminating in the descending duodenum. Left hepatic vein not well evaluated.  Old granulomatous disease in the spleen. Splenomegaly, mild at 13.9 cm craniocaudal.  Fatty atrophy involving the pancreatic body and head.  Normal adrenal glands. Too small to characterize lesions in bilateral kidneys. Left nephrolithiasis. Right renal sinus cysts.  Aortic  atherosclerosis. Porta hepatis node of 2.1 cm on image 46. 1.8 cm on the prior. Indeterminate. Small gastrohepatic ligament nodes which are slightly increased in size, including a 9 mm node on image 35.  Normal abdominal bowel loops. No ascites. Minimal nodularity in the transverse mesocolon at 7 mm on image 55. This is likely new and immediately adjacent to the liver mass.  Bones/Musculoskeletal: No acute osseous abnormality. Moderate thoracic spondylosis  IMPRESSION: 1. Left hepatic lobe infiltrative mass, most consistent with cholangiocarcinoma. Left portal vein involvement. These results will be called to the ordering clinician or representative by the Radiologist Assistant, and communication documented in the PACS Dashboard. 2. Periportal adenopathy, suspicious for nodal metastasis. A nodule in the transverse mesocolon is also suspicious, based on location. 3. If surgical resection is considered, pre and post contrast abdominal MRI may be informative (to differentiate the presumed tumor from a surrounding biliary ductal dilatation). This distinction is difficult on CT due to relative poor soft tissue contrast. 4. Borderline splenomegaly. 5. Left nephrolithiasis. 6. Coronary artery atherosclerosis.   Electronically Signed   By: Abigail Miyamoto M.D.   On: 08/07/2013 17:09   Dg Ercp Biliary & Pancreatic Ducts  08/03/2013   CLINICAL DATA:  Elevated LFTs, biliary dilatation, jaundice, weight loss  EXAM: ERCP WITH SPHINCTEROTOMY  TECHNIQUE: Multiple spot images obtained with the fluoroscopic device and submitted for interpretation post-procedure.  COMPARISON:  Ultrasound abdomen 07/27/2013  FINDINGS: Submitted series demonstrate the intrahepatic and extrahepatic biliary dilatation.  Multiple filling defects are seen within the distal CBD and at the confluence of the right and left hepatic ducts compatible with stones and debris.  With passage of a balloon catheter, multiple stones were retrieved as well as sludge and  debris.  No areas of wall irregularity or definite ductal narrowing identified.  Surgical clips are present from prior cholecystectomy.  CBD stent placed.  IMPRESSION: Multiple filling defects within a dilated biliary tree compatible with retained stones, sludge and debris, which were extracted during the procedure.  These images were submitted for radiologic interpretation only. Please see the procedural report for the amount of contrast and the fluoroscopy time utilized.   Electronically Signed   By: Lavonia Dana M.D.   On: 08/03/2013 17:04    PATHOLOGY:  for RAHMIR, BEEVER ([id number]) Patient: Bryce Daugherty, Bryce Daugherty Collected: 08/03/2013 Client: [facility] Accession: [id number] Received: 08/03/2013 [name] DOB: [birth year] Age: 1 Gender: M Reported: 08/09/2013 [address] Patient Ph: [phone] MRN#: [id number] Linna Hoff [place] [id number] Client Acc#: Chart: Phone: [phone] Fax: LMP: Visit#: [id number] CC: CYTOPATHOLOGY REPORT Adequacy Reason Satisfactory For Evaluation. Diagnosis BILE DUCT FLUID(SPECIMEN 1 OF 1 COLLECTED 08/03/13): MALIGNANT CELLS CONSISTENT WITH CARCINOMA. SEE COMMENT. Claudette Laws MD Pathologist, Electronic Signature (Case signed 08/09/2013) Specimen Clinical Information common bile duct dilation, jaundice, weight loss [facility]- CALL RESULTS Source Bile Duct Brushing, biliary duct material (specimen 1 of 1 collected 08/03/13) Gross Specimen: Received is/are 5cc's of yellow fluid with tissue (BS:bs) Prepared: # Smears: 0 # Concentration Technique Slides (i.e. ThinPrep): 1 # Cell Block: Tissue sent for Histology studies Additional  Studies: n/a Comment Comment: The corresponding bile duct biopsy ([id number]) also shows carcinoma. See the report for outside consultation diagnosis. Case discussed with Dr. Gala Romney on 08/04/13. Report signed out from the following location(s) Technical Component and Interpretation performed at [place], [place], [place] [address]. CLIA    IMPRESSION:  #1. Cholangiocarcinoma with enlarged periportal lymph nodes, possibly related to previous stone in sludge removed at sphincterotomy versus metastatic disease. Disease is more likely than not related to his previous [place] service either related to liver fluke infestation or Agent Orange exposure. #2. Diabetes mellitus, type II, non-insulin requiring, controlled. #3. Hypertension, controlled. #4. Gastroparesis.   PLAN:  #1. Arrangements have been made for consultation with Dr. Kathlene Cote for possible TACE therapy and also with Dr. [name], hepatobiliary surgeon, at [facility]. #2. No chemotherapy this time. #3. Overall plan to be determined after multidisciplinary input is provided. Hopefully intervention with TACE could produce an adequate enough response to warrant definitive surgery. Without surgery, prognosis grave. #4. The patient was encouraged to submit a claim to the [employer] regarding his recent cancer diagnosis and his tenure in [place]. #5. Metoclopramide 5-10 mg 4 times a day before meals and at bedtime for apparent gastroparesis. #6. Followup will be determined after the above consultations are completed.  I appreciate the opportunity sharing in his care.   Farrel Gobble, MD 08/18/2013 6:29 AM   DISCLAIMER:  This note was dictated with voice recognition softwre.  Similar sounding words can inadvertently be transcribed inaccurately and may not be corrected upon review.

## 2013-08-17 NOTE — Progress Notes (Signed)
Bryce Daugherty presented for labwork. Labs per MD order drawn via Peripheral Line 23 gauge needle inserted in right forearm  Good blood return present. Procedure without incident.  Needle removed intact. Patient tolerated procedure well.

## 2013-08-17 NOTE — Telephone Encounter (Signed)
Dr. Saralyn Pilar called from pathology. Cytology specimen reviewed by 3 cytopathologists at [facility]. 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 Of note, the cytology specimen was felt to be more than adequate for interpretation.

## 2013-08-17 NOTE — Patient Instructions (Signed)
[facility] Discharge Instructions  RECOMMENDATIONS MADE BY THE CONSULTANT AND ANY TEST RESULTS WILL BE SENT TO YOUR REFERRING PHYSICIAN.  EXAM FINDINGS BY THE PHYSICIAN TODAY AND SIGNS OR SYMPTOMS TO REPORT TO CLINIC OR PRIMARY PHYSICIAN: Exam and findings as discussed by Dr. Barnet Glasgow.  Want you to see Dr. Kathlene Cote and will also refer you to see Dr. Eugenia Pancoast at [facility]  Call us once you are seen by these physicians.  Mickie Kay, RN  [phone])  MEDICATIONS PRESCRIBED:  none  INSTRUCTIONS/FOLLOW-UP: After being seen by Dr. Kathlene Cote and Dr. Eugenia Pancoast.  Thank you for choosing [facility] to provide your oncology and hematology care.  To afford each patient quality time with our providers, please arrive at least 15 minutes before your scheduled appointment time.  With your help, our goal is to use those 15 minutes to complete the necessary work-up to ensure our physicians have the information they need to help with your evaluation and healthcare recommendations.    Effective January 1st, 2014, we ask that you re-schedule your appointment with our physicians should you arrive 10 or more minutes late for your appointment.  We strive to give you quality time with our providers, and arriving late affects you and other patients whose appointments are after yours.    Again, thank you for choosing [facility].  Our hope is that these requests will decrease the amount of time that you wait before being seen by our physicians.       _____________________________________________________________  Should you have questions after your visit to [facility], please contact our office at [phone] between the hours of 8:30 a.m. and 5:00 p.m.  Voicemails left after 4:30 p.m. will not be returned until the following business day.  For prescription refill requests, have your pharmacy contact our office with your prescription refill request.

## 2013-08-18 ENCOUNTER — Ambulatory Visit (HOSPITAL_COMMUNITY): Payer: 59

## 2013-08-18 LAB — CEA: CEA: 1.5 ng/mL (ref 0.0–5.0)

## 2013-08-21 ENCOUNTER — Encounter (HOSPITAL_COMMUNITY): Payer: Self-pay | Admitting: Oncology

## 2013-08-22 ENCOUNTER — Other Ambulatory Visit (HOSPITAL_COMMUNITY): Payer: Self-pay | Admitting: Hematology and Oncology

## 2013-08-22 ENCOUNTER — Ambulatory Visit
Admission: RE | Admit: 2013-08-22 | Discharge: 2013-08-22 | Disposition: A | Discharge status: Home / Self Care | Payer: 59 | Source: Ambulatory Visit | Attending: Hematology and Oncology | Admitting: Hematology and Oncology

## 2013-08-22 DIAGNOSIS — C221 Intrahepatic bile duct carcinoma: Secondary | ICD-10-CM

## 2013-08-22 NOTE — Progress Notes (Signed)
See other dictation

## 2013-08-25 ENCOUNTER — Encounter (HOSPITAL_COMMUNITY): Payer: Self-pay

## 2013-08-25 ENCOUNTER — Ambulatory Visit (HOSPITAL_COMMUNITY)
Admission: RE | Admit: 2013-08-25 | Discharge: 2013-08-25 | Disposition: A | Discharge status: Home / Self Care | Payer: 59 | Source: Ambulatory Visit | Attending: Hematology and Oncology | Admitting: Hematology and Oncology

## 2013-08-25 DIAGNOSIS — R911 Solitary pulmonary nodule: Secondary | ICD-10-CM | POA: Insufficient documentation

## 2013-08-25 DIAGNOSIS — C221 Intrahepatic bile duct carcinoma: Secondary | ICD-10-CM | POA: Insufficient documentation

## 2013-08-25 LAB — GLUCOSE, CAPILLARY: Glucose-Capillary: 169 mg/dL — ABNORMAL HIGH (ref 70–99)

## 2013-08-25 MED ORDER — FLUDEOXYGLUCOSE F - 18 (FDG) INJECTION
11.0000 | Freq: Once | INTRAVENOUS | Status: AC | PRN
  Administered 2013-08-25: 11 via INTRAVENOUS

## 2013-08-29 ENCOUNTER — Telehealth (HOSPITAL_COMMUNITY): Payer: Self-pay

## 2013-08-29 NOTE — Telephone Encounter (Signed)
Call from patient - was seen by Dr. Eugenia Pancoast at [facility] today and was told that Dr. Eugenia Pancoast wanted to meet with his team, discuss potential surgery, talk with Dr. Barnet Glasgow and would contact patient on Thursday with his recommendations.

## 2013-08-31 HISTORY — PX: LUNG BIOPSY: SHX232

## 2013-09-01 ENCOUNTER — Ambulatory Visit (HOSPITAL_COMMUNITY): Payer: 59

## 2013-09-06 ENCOUNTER — Encounter (HOSPITAL_COMMUNITY): Payer: Self-pay

## 2013-09-12 ENCOUNTER — Other Ambulatory Visit (HOSPITAL_COMMUNITY): Payer: Self-pay | Admitting: Hematology and Oncology

## 2013-10-06 ENCOUNTER — Telehealth: Payer: Self-pay | Admitting: Emergency Medicine

## 2013-10-06 NOTE — Telephone Encounter (Signed)
LM W/ AMY FOR RN TO CALL BACK TO SEE IF THEY HAVE RECEIVED A RESPONSE FROM DR Eugenia Pancoast FROM [facility] ABOUT PT HAVING LUNG AND LIVER SURGERY.   1105AM- DR Idamae Lusher RN CALLED ME BACK TO STATE , THAT ACCORDING TO THEIR CONVERSATIONS W/ PT VIA PHONE THAT EVERYTHING WILL BE HANDLED AT [facility].  IF HE NEEDS CHEMO HE WILL GO BACK TO [facility].

## 2013-10-10 ENCOUNTER — Other Ambulatory Visit (HOSPITAL_COMMUNITY): Payer: Self-pay | Admitting: Oncology

## 2013-10-11 ENCOUNTER — Other Ambulatory Visit: Payer: Self-pay | Admitting: Internal Medicine

## 2013-10-11 ENCOUNTER — Telehealth: Payer: Self-pay

## 2013-10-11 NOTE — Telephone Encounter (Signed)
Routing to Darius Bump to schedule ERCP tomorrow with RMR.

## 2013-10-11 NOTE — Telephone Encounter (Signed)
I called Ladona Horns to schedule and noone is there in End[facility] at this time to tell us what time to add ERCP on, Melanie from Endo will call me first thing in the morning with time to put the order for ERCP in

## 2013-10-11 NOTE — Telephone Encounter (Signed)
Pt came by the office today with a copy of CMP with elevated liver enzymes and said Dr. Eugenia Pancoast from the [facility] wants him seen right away.  Pt also complaining of being weak, no appetite, losing weight and can't sleep.   I reviewed the lab results with Dr. Gala Romney on the phone and he wants pt to be seen here first thing in the morning.  He has an office visit with Laban Emperor, NP at 8:00 AM and is to be NPO after midnight.  Pt is instructed to go to the ED if he has fever, chills or abdominal pain before his appt in the AM.   I UPDATED THE MED LIST AND PT IS NOT ON COUMADIN. HE DOES TAKE ASA 81 MG DAILY.

## 2013-10-11 NOTE — Telephone Encounter (Signed)
Contact noted

## 2013-10-12 ENCOUNTER — Other Ambulatory Visit: Payer: Self-pay | Admitting: Internal Medicine

## 2013-10-12 ENCOUNTER — Encounter: Payer: Self-pay | Admitting: Gastroenterology

## 2013-10-12 ENCOUNTER — Encounter (HOSPITAL_COMMUNITY): Payer: 59 | Admitting: Anesthesiology

## 2013-10-12 ENCOUNTER — Ambulatory Visit (HOSPITAL_COMMUNITY)
Admission: RE | Admit: 2013-10-12 | Discharge: 2013-10-12 | Disposition: A | Discharge status: Home / Self Care | Payer: 59 | Source: Ambulatory Visit | Attending: Internal Medicine | Admitting: Internal Medicine

## 2013-10-12 ENCOUNTER — Ambulatory Visit (HOSPITAL_COMMUNITY): Payer: 59 | Admitting: Anesthesiology

## 2013-10-12 ENCOUNTER — Encounter (HOSPITAL_COMMUNITY): Payer: Self-pay | Admitting: *Deleted

## 2013-10-12 ENCOUNTER — Ambulatory Visit (INDEPENDENT_AMBULATORY_CARE_PROVIDER_SITE_OTHER): Payer: 59 | Admitting: Gastroenterology

## 2013-10-12 ENCOUNTER — Ambulatory Visit (HOSPITAL_COMMUNITY): Payer: 59

## 2013-10-12 ENCOUNTER — Encounter (HOSPITAL_COMMUNITY)
Admission: RE | Disposition: A | Discharge status: Home / Self Care | Payer: Self-pay | Source: Ambulatory Visit | Attending: Internal Medicine

## 2013-10-12 VITALS — BP 130/82 | HR 92 | Temp 97.5°F | Ht 69.0 in | Wt 209.6 lb

## 2013-10-12 DIAGNOSIS — Z7982 Long term (current) use of aspirin: Secondary | ICD-10-CM | POA: Insufficient documentation

## 2013-10-12 DIAGNOSIS — R17 Unspecified jaundice: Secondary | ICD-10-CM

## 2013-10-12 DIAGNOSIS — T85590A Other mechanical complication of bile duct prosthesis, initial encounter: Secondary | ICD-10-CM

## 2013-10-12 DIAGNOSIS — E119 Type 2 diabetes mellitus without complications: Secondary | ICD-10-CM | POA: Insufficient documentation

## 2013-10-12 DIAGNOSIS — K838 Other specified diseases of biliary tract: Secondary | ICD-10-CM

## 2013-10-12 DIAGNOSIS — Z87891 Personal history of nicotine dependence: Secondary | ICD-10-CM | POA: Insufficient documentation

## 2013-10-12 DIAGNOSIS — K831 Obstruction of bile duct: Secondary | ICD-10-CM

## 2013-10-12 DIAGNOSIS — T85898A Other specified complication of other internal prosthetic devices, implants and grafts, initial encounter: Secondary | ICD-10-CM

## 2013-10-12 DIAGNOSIS — N189 Chronic kidney disease, unspecified: Secondary | ICD-10-CM | POA: Insufficient documentation

## 2013-10-12 DIAGNOSIS — I129 Hypertensive chronic kidney disease with stage 1 through stage 4 chronic kidney disease, or unspecified chronic kidney disease: Secondary | ICD-10-CM | POA: Insufficient documentation

## 2013-10-12 DIAGNOSIS — Z6830 Body mass index (BMI) 30.0-30.9, adult: Secondary | ICD-10-CM | POA: Insufficient documentation

## 2013-10-12 DIAGNOSIS — C221 Intrahepatic bile duct carcinoma: Secondary | ICD-10-CM | POA: Insufficient documentation

## 2013-10-12 DIAGNOSIS — K5710 Diverticulosis of small intestine without perforation or abscess without bleeding: Secondary | ICD-10-CM | POA: Insufficient documentation

## 2013-10-12 HISTORY — PX: BILIARY STENT PLACEMENT: SHX5538

## 2013-10-12 HISTORY — PX: ERCP: SHX5425

## 2013-10-12 LAB — HEPATIC FUNCTION PANEL
ALK PHOS: 586 U/L — AB (ref 39–117)
ALT: 168 U/L — AB (ref 0–53)
AST: 221 U/L — ABNORMAL HIGH (ref 0–37)
Albumin: 2.9 g/dL — ABNORMAL LOW (ref 3.5–5.2)
BILIRUBIN INDIRECT: 3.2 mg/dL — AB (ref 0.3–0.9)
BILIRUBIN TOTAL: 12.1 mg/dL — AB (ref 0.3–1.2)
Bilirubin, Direct: 8.9 mg/dL — ABNORMAL HIGH (ref 0.0–0.3)
TOTAL PROTEIN: 6.5 g/dL (ref 6.0–8.3)

## 2013-10-12 LAB — BASIC METABOLIC PANEL
BUN: 17 mg/dL (ref 6–23)
CALCIUM: 9 mg/dL (ref 8.4–10.5)
CO2: 22 meq/L (ref 19–32)
CREATININE: 0.63 mg/dL (ref 0.50–1.35)
Chloride: 100 mEq/L (ref 96–112)
GFR calc Af Amer: >90 mL/min (ref >90)
GFR calc non Af Amer: >90 mL/min (ref >90)
Glucose, Bld: 170 mg/dL — ABNORMAL HIGH (ref 70–99)
Potassium: 4 mEq/L (ref 3.7–5.3)
Sodium: 137 mEq/L (ref 137–147)

## 2013-10-12 LAB — GLUCOSE, CAPILLARY
Glucose-Capillary: 159 mg/dL — ABNORMAL HIGH (ref 70–99)
Glucose-Capillary: 103 mg/dL — ABNORMAL HIGH (ref 70–99)

## 2013-10-12 LAB — HEMOGLOBIN AND HEMATOCRIT, BLOOD
HEMATOCRIT: 42.6 % (ref 39.0–52.0)
Hemoglobin: 14.7 g/dL (ref 13.0–17.0)

## 2013-10-12 SURGERY — ERCP, WITH INTERVENTION IF INDICATED
Anesthesia: General | Site: Esophagus

## 2013-10-12 MED ORDER — LACTATED RINGERS IV SOLN
INTRAVENOUS | Status: DC
  Administered 2013-10-12: 10:00:00 via INTRAVENOUS

## 2013-10-12 MED ORDER — MIDAZOLAM HCL 2 MG/2ML IJ SOLN
1.0000 mg | INTRAMUSCULAR | Status: DC | PRN
  Administered 2013-10-12: 2 mg via INTRAVENOUS

## 2013-10-12 MED ORDER — SODIUM CHLORIDE 0.9 % IV SOLN
INTRAVENOUS | Status: DC | PRN
  Administered 2013-10-12: 12:00:00

## 2013-10-12 MED ORDER — ONDANSETRON HCL 4 MG/2ML IJ SOLN
4.0000 mg | Freq: Once | INTRAMUSCULAR | Status: AC
  Administered 2013-10-12: 4 mg via INTRAVENOUS

## 2013-10-12 MED ORDER — OMEPRAZOLE 20 MG PO CPDR: 20.0000 mg | DELAYED_RELEASE_CAPSULE | Freq: Every day | ORAL | Status: DC

## 2013-10-12 MED ORDER — PROPOFOL 10 MG/ML IV BOLUS
INTRAVENOUS | Status: AC
  Filled 2013-10-12: qty 20

## 2013-10-12 MED ORDER — GLYCOPYRROLATE 0.2 MG/ML IJ SOLN
0.2000 mg | Freq: Once | INTRAMUSCULAR | Status: AC
  Administered 2013-10-12: 0.2 mg via INTRAVENOUS

## 2013-10-12 MED ORDER — LACTATED RINGERS IV SOLN
INTRAVENOUS | Status: DC | PRN
  Administered 2013-10-12: 10:00:00 via INTRAVENOUS

## 2013-10-12 MED ORDER — SODIUM CHLORIDE 0.9 % IV SOLN
INTRAVENOUS | Status: AC
  Administered 2013-10-12: 12:00:00 via INTRAVENOUS
  Filled 2013-10-12: qty 50

## 2013-10-12 MED ORDER — LIDOCAINE HCL (CARDIAC) 20 MG/ML IV SOLN
INTRAVENOUS | Status: DC | PRN
  Administered 2013-10-12: 50 mg via INTRAVENOUS

## 2013-10-12 MED ORDER — LIDOCAINE VISCOUS 2 % MT SOLN
OROMUCOSAL | Status: AC
  Filled 2013-10-12: qty 15

## 2013-10-12 MED ORDER — SODIUM CHLORIDE 0.9 % IV SOLN
3.0000 g | Freq: Once | INTRAVENOUS | Status: AC
  Administered 2013-10-12: 3 g via INTRAVENOUS
  Filled 2013-10-12: qty 3

## 2013-10-12 MED ORDER — LACTATED RINGERS IV SOLN: INTRAVENOUS | Status: DC

## 2013-10-12 MED ORDER — GLUCAGON HCL RDNA (DIAGNOSTIC) 1 MG IJ SOLR
INTRAMUSCULAR | Status: AC
  Filled 2013-10-12: qty 2

## 2013-10-12 MED ORDER — LIDOCAINE HCL (PF) 1 % IJ SOLN
INTRAMUSCULAR | Status: AC
  Filled 2013-10-12: qty 5

## 2013-10-12 MED ORDER — GLYCOPYRROLATE 0.2 MG/ML IJ SOLN
INTRAMUSCULAR | Status: AC
  Filled 2013-10-12: qty 3

## 2013-10-12 MED ORDER — PROPOFOL 10 MG/ML IV BOLUS
INTRAVENOUS | Status: DC | PRN
  Administered 2013-10-12: 150 mg via INTRAVENOUS

## 2013-10-12 MED ORDER — SODIUM CHLORIDE 0.9 % IJ SOLN
INTRAMUSCULAR | Status: AC
  Filled 2013-10-12: qty 20

## 2013-10-12 MED ORDER — FENTANYL CITRATE 0.05 MG/ML IJ SOLN
INTRAMUSCULAR | Status: DC | PRN
  Administered 2013-10-12 (×2): 50 ug via INTRAVENOUS
  Administered 2013-10-12: 100 ug via INTRAVENOUS
  Administered 2013-10-12: 50 ug via INTRAVENOUS

## 2013-10-12 MED ORDER — ARTIFICIAL TEARS OP OINT
TOPICAL_OINTMENT | OPHTHALMIC | Status: AC
  Filled 2013-10-12: qty 3.5

## 2013-10-12 MED ORDER — PHENYLEPHRINE HCL 10 MG/ML IJ SOLN
INTRAMUSCULAR | Status: AC
  Filled 2013-10-12: qty 1

## 2013-10-12 MED ORDER — ONDANSETRON HCL 4 MG/2ML IJ SOLN
INTRAMUSCULAR | Status: AC
  Filled 2013-10-12: qty 2

## 2013-10-12 MED ORDER — ROCURONIUM BROMIDE 50 MG/5ML IV SOLN
INTRAVENOUS | Status: AC
  Filled 2013-10-12: qty 1

## 2013-10-12 MED ORDER — ONDANSETRON HCL 4 MG/2ML IJ SOLN: 4.0000 mg | Freq: Once | INTRAMUSCULAR | Status: DC | PRN

## 2013-10-12 MED ORDER — GLYCOPYRROLATE 0.2 MG/ML IJ SOLN
INTRAMUSCULAR | Status: AC
  Filled 2013-10-12: qty 1

## 2013-10-12 MED ORDER — SODIUM CHLORIDE 0.9 % IV SOLN: INTRAVENOUS | Status: DC

## 2013-10-12 MED ORDER — FENTANYL CITRATE 0.05 MG/ML IJ SOLN: 25.0000 ug | INTRAMUSCULAR | Status: DC | PRN

## 2013-10-12 MED ORDER — SIMETHICONE 40 MG/0.6ML PO SUSP
ORAL | Status: DC | PRN
  Administered 2013-10-12: 12:00:00

## 2013-10-12 MED ORDER — FENTANYL CITRATE 0.05 MG/ML IJ SOLN
INTRAMUSCULAR | Status: AC
  Filled 2013-10-12: qty 5

## 2013-10-12 MED ORDER — SEVOFLURANE IN SOLN
RESPIRATORY_TRACT | Status: AC
  Filled 2013-10-12: qty 250

## 2013-10-12 MED ORDER — MIDAZOLAM HCL 2 MG/2ML IJ SOLN
INTRAMUSCULAR | Status: AC
  Filled 2013-10-12: qty 2

## 2013-10-12 SURGICAL SUPPLY — 15 items
BLOCK BITE 60FR ADLT L/F BLUE (MISCELLANEOUS) ×4 IMPLANT
DEVICE LOCKING W-BIOPSY CAP (MISCELLANEOUS) ×4 IMPLANT
FLOOR PAD 36X40 (MISCELLANEOUS) ×4
KIT CLEAN ENDO COMPLIANCE (KITS) ×4 IMPLANT
PAD FLOOR 36X40 (MISCELLANEOUS) ×2 IMPLANT
SNARE ROTATE MED OVAL 20MM (MISCELLANEOUS) ×4 IMPLANT
SNARE SHORT THROW 13M SML OVAL (MISCELLANEOUS) ×4 IMPLANT
SPHINCTEROTOME HYDRATOME 44 (MISCELLANEOUS) ×4 IMPLANT
SPONGE GAUZE 4X4 12PLY (GAUZE/BANDAGES/DRESSINGS) ×4 IMPLANT
STENT RX PRELOADED 10FRX12CM (Stent) ×4 IMPLANT
SYR 50ML LL SCALE MARK (SYRINGE) ×4 IMPLANT
SYSTEM CONTINUOUS INJECTION (MISCELLANEOUS) ×4 IMPLANT
TUBING ENDO SMARTCAP PENTAX (MISCELLANEOUS) ×4 IMPLANT
TUBING INSUFFLATOR CO2MPACT (TUBING) ×4 IMPLANT
WATER STERILE IRR 1000ML POUR (IV SOLUTION) ×4 IMPLANT

## 2013-10-12 NOTE — Transfer of Care (Signed)
Immediate Anesthesia Transfer of Care Note  Patient: Bryce Daugherty  Procedure(s) Performed: Procedure(s): ENDOSCOPIC RETROGRADE CHOLANGIOPANCREATOGRAPHY (ERCP) (N/A) BILIARY STENT REPLACEMENT (N/A)  Patient Location: PACU  Anesthesia Type:General  Level of Consciousness: awake, alert , oriented and patient cooperative  Airway & Oxygen Therapy: Patient Spontanous Breathing and Patient connected to face mask oxygen  Post-op Assessment: Report given to PACU RN and Post -op Vital signs reviewed and stable  Post vital signs: Reviewed and stable  Complications: No apparent anesthesia complications

## 2013-10-12 NOTE — Telephone Encounter (Signed)
ERCP was done today by RMR

## 2013-10-12 NOTE — Procedures (Signed)
ERCP performed. Occluded plastic biliary stent removed. A new 10 French, 12 cm plastic stent placed across the strictured common hepatic duct into the right biliary system. Patient tolerated well. See full dictated procedure report

## 2013-10-12 NOTE — Assessment & Plan Note (Signed)
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 ERCP April 2015 performed with sphincterotomy, stone extraction, plastic stent placement. Most recent bilirubin 7.9; prior to original ERCP, bilirubin was in the 26 range. Our last labs in epic revealed a bilirubin of 3.9; therefore, he has had a significant increase in his overall liver numbers, associated with recurrent jaundice, fatigue, loss of appetite and weight loss. He does not appear toxic, vitals are stable, and he is afebrile.  Proceed with ERCP today with stent exchange, intra-operative cholangiogram, possible sphincterotomy. I discussed in detail the risks and benefits, specifically detailing 1 in 10 chance of pancreatitis. Both patient and wife are well-informed and desire to proceed. HE HAS REMAINED NPO SINCE MIDNIGHT.  Will obtain stat LFTs in short stay Unasyn 3 g IV on call for prophylaxis As of note, no dye/contrast allergy, no anticoagulation.

## 2013-10-12 NOTE — H&P (View-Only) (Signed)
Referring Provider: Dr. Eugenia Pancoast Primary Care Physician:  [name], MD Primary Gastroenterologist:  Dr. Gala Romney   CC: Jaundice, need for ERCP  HPI:   Bryce Daugherty is a very pleasant 68 year old male with a recent diagnosis of cholangiocarcinoma in  April 2015. He had presented with painless jaundice to our practice, with a bilirubin of 26. Underwent ERCP April 2015 with sphincterotomy, stone extraction, plastic stent placement. Bile duct brushings performed consistent with primary cholangiocarcinoma. Interestingly, he was seen at [facility] for evaluation by Dr. Eugenia Pancoast for consideration of surgery, whereby a PET scan revealed a right upper lobe lung nodule. IR CT guided biopsy then performed revealing a second primary lung adenocarcinoma. He underwent pinpoint radiation and just finished with this last Friday. Late May he developed a pneumothorax and required a chest tube; this was removed 5/29, with chest xray revealing no evidence of pneumothorax.      Surgery is scheduled July 17 with DR. Howerton for extended left liver resection. Recent blood work on 6/23 revealed bilirubin increasing at 7.9, alk phos 404, AST 148, ALT 123.    Dark urine, jaundiced, BMs lighter in color. Not much appetite. Had originally lost weight during start of process in April 2015. lost 8 lbs then regained, now lost again. Extreme fatigue. Noted symptoms starting gradually last week. _0 84  . Catarac both eyes    . Tonsillectomy    . Ercp N/A 08/03/2013    Dr. Gala Romney: with sphincterotomy, balloon dilation, stone extraction, plastic stent placement. Tbili 26 pre-operatively  . Sphincterotomy N/A 08/03/2013    Procedure: SPHINCTEROTOMY;  Surgeon: Daneil Dolin, MD;  Location: [facility];  Service: Endoscopy;  Laterality: N/A;  with dilation   . Biliary stent placement N/A 08/03/2013    Procedure: BILIARY STENT PLACEMENT;  Surgeon: Daneil Dolin, MD;  Location: [facility];  Service: Endoscopy;  Laterality: N/A;  . Removal of stones N/A 08/03/2013    Procedure: REMOVAL OF STONES;  Surgeon: Daneil Dolin, MD;  Location: [facility];  Service: Endoscopy;  Laterality: N/A;    OUTPATIENT MEDICATIONS:  ASA 81 mg daily Atenolol 25 mg daily Cardura 4 mg daily Glucotrol 5 mg daily Metformin 500 mg daily Reglan 5 mg before meals and bedtime Prilosec 20 mg OTC daily  Allergies as of 10/12/2013  . (No Known Allergies)    Family History  Problem Relation Age of Onset  . Pancreatic cancer Mother     age 39  . Colon cancer Neg Hx   . Heart attack Father     History   Social History  . Marital Status: Married    Spouse Name: N/A    Number of Children: N/A  . Years of  Education: N/A   Occupational History  . [employer]    Social History Main Topics  . Smoking status: Former Smoker -- 1.00 packs/day for 20 years    Types: Cigarettes    Quit date: 04/26/1988  . Smokeless tobacco: Never Used     Comment: quit smoking about 25 years ago  . Alcohol Use: No  . Drug Use: No  . Sexual Activity: Not on file   Other Topics Concern  . Not on file   Social History Narrative   Spent one year in [place] as a member of the [employer] in the [place] area during the [place] war and had exposure to agent orange and liver flukes.     Review of  Systems: As mentioned in HPI.   Physical Exam: BP 130/82  Pulse 92  Temp(Src) 97.5 F (36.4 C) (Oral)  Ht _0  (1.753 m)  Wt 209 lb 9.6 oz (95.074 kg)  BMI 30.94 kg/m2 General:   Alert and oriented. Jaundiced, non-toxic appearing Head:  Normocephalic and atraumatic. Eyes:  +scleral icterus Ears:  Normal auditory acuity. Nose:  No deformity, discharge,  or lesions. Mouth:  No deformity or lesions, mucosa pink and moist.  Lungs:  Clear to auscultation bilaterally, without wheezing, rales, or rhonchi.  Heart:  S1, S2 present without murmurs noted.  Abdomen:  +BS, soft, non-tender and non-distended. No rebound or guarding Rectal:  Deferred  Msk:  Symmetrical without gross deformities. Normal posture. Extremities:  Without clubbing or edema. Neurologic:  Alert and  oriented x4;  grossly normal neurologically. Skin: Jaundice Psych:  Alert and cooperative. Normal mood and affect.     Lab Results  Component Value Date   WBC 8.0 08/17/2013   HGB 13.3 08/17/2013   HCT 39.5 08/17/2013   MCV 93.8 08/17/2013   PLT 155 08/17/2013    Lab Results  Component Value Date   ALT 72* 08/17/2013   AST 72* 08/17/2013   ALKPHOS 238* 08/17/2013   BILITOT 3.9* 08/17/2013    MOST RECENT Outside labs June 23: Tbili 7.9, AP 404, AST 148, ALT 123

## 2013-10-12 NOTE — Interval H&P Note (Signed)
History and Physical Interval Note:  10/12/2013 11:14 AM  Bryce Daugherty  has presented today for surgery, with the diagnosis of BILIARY STENT OBSTRUCTION  The various methods of treatment have been discussed with the patient and family. After consideration of risks, benefits and other options for treatment, the patient has consented to  Procedure(s) with comments: ENDOSCOPIC RETROGRADE CHOLANGIOPANCREATOGRAPHY (ERCP) (N/A) - 10:15 SPHINCTEROTOMY (N/A) BILIARY STENT PLACEMENT (N/A) as a surgical intervention .  The patient's history has been reviewed, patient examined, no change in status, stable for surgery.  I have reviewed the patient's chart and labs.  Questions were answered to the patient's satisfaction.     Robert Rourk  No change since she was seen in the office this morning. ERCP with stent change per plan. Discussed risks, benefits, limitations and alternatives. All parties agreeable.

## 2013-10-12 NOTE — Op Note (Signed)
Bryce Daugherty, RATHGEBER                 ACCOUNT NO.:  [id number]  MEDICAL RECORD NO.:  [id number]  LOCATION:  [facility]                          FACILITY:  [facility]  PHYSICIAN:  R. Garfield Cornea, MD [place]:  [birth year]  DATE OF PROCEDURE:  10/12/2013 DATE OF DISCHARGE:                              OPERATIVE REPORT   PROCEDURE:  Endoscopic retrograde cholangiopancreatography with plastic biliary stent exchange.  INDICATIONS FOR PROCEDURE:  The patient is a very pleasant 68 year old gentleman with cholangiocarcinoma involving a good part of the left lobe of the liver producing obstructing jaundice.  He underwent an ERCP with sphincterotomy and plastic stent placement back on April 16th by me. His bilirubin gradually went from 26 down to the 2 range.  Recently, it has  climbed to 7.9 [facility] labs 2 days ago).  This morning his bilirubin is 9.  He has become jaundiced.  Once again he has not had any fever or chills.  I spoke to Dr. Eugenia Pancoast earlier today.  We have expedited his evaluation here.  In all likelihood, the plastic biliary stent has become occluded.  We have offered the patient ERCP under general anesthesia, with stent exchange.  Risks, benefits, limitations, alternatives, and imponderables have been discussed.  Questions have been answered.  All parties agreeable.  PROCEDURE NOTE:  General endotracheal anesthesia induced by Dr. Duwayne Heck and associates.  The patient received Unasyn 3 g IV prior to the procedure.  INSTRUMENT:  Pentax video chip system.  FINDINGS:  Cursory examination of the distal esophagus, stomach, and duodenum through the second portion revealed the previously placed biliary stent protruding from the ampullary orifice.  There was a fairly large juxta ampullary duodenal diverticulum present.  The side and end ports of the duodenum end of the stent appeared occluded. Scope was pulled back to the short position 55 cm from the incisors; scout film was  taken.  Indwelling biliary stent was grasped with the snare through the scope and pulled out of the patient's mouth intact. Subsequently, the scope was reintroduced into the duodenum using the Microvasive sphincterotome, the ampulla was approached.  There was a moderate persisting opening given prior sphincterotomy.  Using guidewire Palpation. I almost immediately achieved  deep biliary cannulation.  There appeared to be a good 3 cm stricture involving the common hepatic duct with upstream dilation of what appeared to be the right system.  During Opacification of the duct, there did appear to be some spillage of contrast into the duodenum and back into the stomach producing some outline of gastric rugae on the background of the cholangiogram.  I placed a wire deep into the right system, spanning the stricture.  Sphincterotome was then railed off.  A new 10-French, 12 cm plastic stent delivery system was loaded onto the wire and was advanced beyond the stricture under fluoroscopic control;  The stent was railed across the stricture and ultimately deployed in excellent position and very similar position as the  the prior stent.  With this maneuver, very dark somewhat viscous bile was seen pouring through the side and end ports on the duodenal end of the stent.  The patient tolerated the procedure well and  was taken to PACU in stable condition.  I have reviewed the static films with Dr. Thornton Papas, following the procedure, and his interpretation is consistent with the above real time observation.  IMPRESSION: 1. 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 Duodenal diverticulum. 3. The pancreatic duct was not manipulated or injected.  RECOMMENDATIONS: 1. Clear liquid diet remainder today; advance as tolerated tomorrow. 2. Five day course of Levaquin. 3. Provide  prescription for Percocet 5/325, #30 one every 6 hours as needed for pain. 4. Repeat hepatic profile on June 29th. 5. The patient is to keep his follow up appointments with Dr.     Eugenia Pancoast.     Bridgette Habermann, MD [facility]     RMR/MEDQ  D:  10/12/2013  T:  10/12/2013  Job:  [id number]  cc:   [name], MD Fax: [phone]  Jasper Loser. Luan Pulling, M.D. Fax: [phone]

## 2013-10-12 NOTE — Anesthesia Postprocedure Evaluation (Signed)
  Anesthesia Post-op Note  Patient: Bryce Daugherty  Procedure(s) Performed: Procedure(s): ENDOSCOPIC RETROGRADE CHOLANGIOPANCREATOGRAPHY (ERCP) (N/A) BILIARY STENT REPLACEMENT (N/A)  Patient Location: PACU  Anesthesia Type:General  Level of Consciousness: awake, alert , oriented and patient cooperative  Airway and Oxygen Therapy: Patient Spontanous Breathing and Patient connected to face mask oxygen  Post-op Pain: mild  Post-op Assessment: Post-op Vital signs reviewed, Patient's Cardiovascular Status Stable, Respiratory Function Stable, Patent Airway, No signs of Nausea or vomiting and Pain level controlled  Post-op Vital Signs: Reviewed and stable  Last Vitals:  Filed Vitals:   10/12/13 1243  BP:   Pulse:   Temp: 36.7 C  Resp:     Complications: No apparent anesthesia complications

## 2013-10-12 NOTE — Patient Instructions (Signed)
We have scheduled you for an ERCP with stent exchange with Dr. Gala Romney today. We need to have blood work done before the procedure. You can do this over at the short stay area.

## 2013-10-12 NOTE — Progress Notes (Signed)
    Referring Bryce Daugherty: Dr. Howerton Primary Care Physician:  HAWKINS,EDWARD L, MD Primary Gastroenterologist:  Dr. Rourk   CC: Jaundice, need for ERCP  HPI:   Bryce Daugherty is a very pleasant 68-year-old male with a recent diagnosis of cholangiocarcinoma in  April 2015. He had presented with painless jaundice to our practice, with a bilirubin of 26. Underwent ERCP April 2015 with sphincterotomy, stone extraction, plastic stent placement. Bile duct brushings performed consistent with primary cholangiocarcinoma. Interestingly, he was seen at [facility] for evaluation by Dr. Howerton for consideration of surgery, whereby a PET scan revealed a right upper lobe lung nodule. IR CT guided biopsy then performed revealing a second primary lung adenocarcinoma. He underwent pinpoint radiation and just finished with this last Friday. Late May he developed a pneumothorax and required a chest tube; this was removed 5/29, with chest xray revealing no evidence of pneumothorax.      Surgery is scheduled July 17 with DR. Howerton for extended left liver resection. Recent blood work on 6/23 revealed bilirubin increasing at 7.9, alk phos 404, AST 148, ALT 123.    Dark urine, jaundiced, BMs lighter in color. Not much appetite. Had originally lost weight during start of process in April 2015. lost 8 lbs then regained, now lost again. Extreme fatigue. Noted symptoms starting gradually last week. Sunday, kids kept saying how yellow he was. No abdominal pain. Belching, chest discomfort eased up with Prilosec. No recent fever/chills. No SOB at rest. +DOE.   Past Medical History  Diagnosis Date  . Hypertension   . Chronic kidney disease   . Arthritis     lower back, hips knees  . Kidney stones     bilateral stones pain since 04/11/11  . Diabetes mellitus     treatment for 2 years  . 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 Adenocarcinoma, lung     s/p radiation treatments    Past Surgical History  Procedure  Laterality Date  . Cystectomy  under left arm  . Colonoscopy  03/24/2011    SLF:internal hemorrhoids  . Cholecystectomy      1984  . Catarac both eyes    . Tonsillectomy    . Ercp N/A 08/03/2013    Dr. Rourk: with sphincterotomy, balloon dilation, stone extraction, plastic stent placement. Tbili 26 pre-operatively  . Sphincterotomy N/A 08/03/2013    Procedure: SPHINCTEROTOMY;  Surgeon: Robert M Rourk, MD;  Location: [facility];  Service: Endoscopy;  Laterality: N/A;  with dilation   . Biliary stent placement N/A 08/03/2013    Procedure: BILIARY STENT PLACEMENT;  Surgeon: Robert M Rourk, MD;  Location: [facility];  Service: Endoscopy;  Laterality: N/A;  . Removal of stones N/A 08/03/2013    Procedure: REMOVAL OF STONES;  Surgeon: Robert M Rourk, MD;  Location: [facility];  Service: Endoscopy;  Laterality: N/A;    OUTPATIENT MEDICATIONS:  ASA 81 mg daily Atenolol 25 mg daily Cardura 4 mg daily Glucotrol 5 mg daily Metformin 500 mg daily Reglan 5 mg before meals and bedtime Prilosec 20 mg OTC daily  Allergies as of 10/12/2013  . (No Known Allergies)    Family History  Problem Relation Age of Onset  . Pancreatic cancer Mother     age 68  . Colon cancer Neg Hx   . Heart attack Father     History   Social History  . Marital Status: Married    Spouse Name: N/A    Number of Children: N/A  . Years of   Education: N/A   Occupational History  . [employer]    Social History Main Topics  . Smoking status: Former Smoker -- 1.00 packs/day for 20 years    Types: Cigarettes    Quit date: 04/26/1988  . Smokeless tobacco: Never Used     Comment: quit smoking about 25 years ago  . Alcohol Use: No  . Drug Use: No  . Sexual Activity: Not on file   Other Topics Concern  . Not on file   Social History Narrative   Spent one year in [place] as a member of the [employer] in the [place] area during the [place] war and had exposure to agent orange and liver flukes.     Review of  Systems: As mentioned in HPI.   Physical Exam: BP 130/82  Pulse 92  Temp(Src) 97.5 F (36.4 C) (Oral)  Ht 5' 9" (1.753 m)  Wt 209 lb 9.6 oz (95.074 kg)  BMI 30.94 kg/m2 General:   Alert and oriented. Jaundiced, non-toxic appearing Head:  Normocephalic and atraumatic. Eyes:  +scleral icterus Ears:  Normal auditory acuity. Nose:  No deformity, discharge,  or lesions. Mouth:  No deformity or lesions, mucosa pink and moist.  Lungs:  Clear to auscultation bilaterally, without wheezing, rales, or rhonchi.  Heart:  S1, S2 present without murmurs noted.  Abdomen:  +BS, soft, non-tender and non-distended. No rebound or guarding Rectal:  Deferred  Msk:  Symmetrical without gross deformities. Normal posture. Extremities:  Without clubbing or edema. Neurologic:  Alert and  oriented x4;  grossly normal neurologically. Skin: Jaundice Psych:  Alert and cooperative. Normal mood and affect.     Lab Results  Component Value Date   WBC 8.0 08/17/2013   HGB 13.3 08/17/2013   HCT 39.5 08/17/2013   MCV 93.8 08/17/2013   PLT 155 08/17/2013    Lab Results  Component Value Date   ALT 72* 08/17/2013   AST 72* 08/17/2013   ALKPHOS 238* 08/17/2013   BILITOT 3.9* 08/17/2013    MOST RECENT Outside labs June 23: Tbili 7.9, AP 404, AST 148, ALT 123 

## 2013-10-12 NOTE — Interval H&P Note (Signed)
History and Physical Interval Note:  10/12/2013 9:38 AM  Bryce Daugherty  has presented today for surgery, with the diagnosis of BILIARY STENT OBSTRUCTION  The various methods of treatment have been discussed with the patient and family. After consideration of risks, benefits and other options for treatment, the patient has consented to  Procedure(s) with comments: ENDOSCOPIC RETROGRADE CHOLANGIOPANCREATOGRAPHY (ERCP) (N/A) - 10:15 SPHINCTEROTOMY (N/A) BILIARY STENT PLACEMENT (N/A) as a surgical intervention .  The patient's history has been reviewed, patient examined, no change in status, stable for surgery.  I have reviewed the patient's chart and labs.  Questions were answered to the patient's satisfaction.     Bryce Daugherty  Attending note:  I discussed the case with Dr. [name] at [facility] today via telephone. He tells me patient's bilirubin nadired to 2 over there.  Plans are to replace the plastic stent today as it is most likely has occluded (less likely migrated). Right side needs to be drained. Draining the right side has worked well for him over the past couple of months The left system likely obliterated from tumor. Plans are being made for aggressive liver resection in a couple of weeks. ERCP plan today for stent change today.

## 2013-10-12 NOTE — Discharge Instructions (Addendum)
Standard post ERCP instructions  Light diet remainder of the day; advance diet as tolerated tomorrow  Levaquin 250 mg daily x5 days  Percocet 5/325-1 tablet every 6 hours as needed for pain  Hepatic profile June 29  Followup with Dr. Eugenia Pancoast as planned  Endoscopic Retrograde Cholangiopancreatography (ERCP), Care After Refer to this sheet in the next few weeks. These instructions provide you with information on caring for yourself after your procedure. Your health care provider may also give you more specific instructions. Your treatment has been planned according to current medical practices, but problems sometimes occur. Call your health care provider if you have any problems or questions after your procedure.  WHAT TO EXPECT AFTER THE PROCEDURE  After your procedure, it is typical to feel:   Soreness in your throat.   Sick to your stomach (nauseous).   Bloated.  Dizzy.   Fatigued. HOME CARE INSTRUCTIONS  Have a friend or family member stay with you for the first 24 hours after your procedure.  Start taking your usual medicines and eating normally as soon as you feel well enough to do so or as directed by your health care provider. SEEK MEDICAL CARE IF:  You have abdominal pain.   You develop signs of infection, such as:   Chills.   Feeling unwell.  SEEK IMMEDIATE MEDICAL CARE IF:  You have difficulty swallowing.  You have worsening throat, chest, or abdominal pain.  You vomit.  You have bloody or very black stools.  You have a fever.

## 2013-10-12 NOTE — Anesthesia Preprocedure Evaluation (Addendum)
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 Chest tube d/c'd few weeks ago.Denies SOB. breath sounds clear to auscultation        Cardiovascular hypertension, Pt. on medications Rhythm:Regular Rate:Normal     Neuro/Psych    GI/Hepatic negative GI ROS,   Endo/Other  diabetes  Renal/GU Renal disease     Musculoskeletal   Abdominal   Peds  Hematology   Anesthesia Other Findings   Reproductive/Obstetrics                         Anesthesia Physical Anesthesia Plan  ASA: III  Anesthesia Plan: General   Post-op Pain Management:    Induction: Intravenous, Rapid sequence and Cricoid pressure planned  Airway Management Planned: Oral ETT  Additional Equipment:   Intra-op Plan:   Post-operative Plan: Extubation in OR  Informed Consent: I have reviewed the patients History and Physical, chart, labs and discussed the procedure including the risks, benefits and alternatives for the proposed anesthesia with the patient or authorized representative who has indicated his/her understanding and acceptance.     Plan Discussed with:   Anesthesia Plan Comments:         Anesthesia Quick Evaluation                                  Anesthesia Evaluation  Patient identified by MRN, date of birth, ID band Patient awake    Reviewed: Allergy & Precautions, H&P , NPO status , Patient's Chart, lab work & pertinent test results, reviewed documented beta blocker date and time   Airway Mallampati: I TM Distance: >3 FB Neck ROM: Full    Dental  (+) Poor Dentition, Chipped, Missing,    Pulmonary former smoker,  breath sounds clear to auscultation        Cardiovascular hypertension, Pt. on medications and Pt. on home beta blockers Rhythm:Regular Rate:Normal     Neuro/Psych    GI/Hepatic CBD stones, nausea   Endo/Other  diabetes, Type 2, Oral Hypoglycemic Agents  Renal/GU Renal disease     Musculoskeletal   Abdominal   Peds  Hematology   Anesthesia Other Findings   Reproductive/Obstetrics                           Anesthesia Physical Anesthesia Plan  ASA: III  Anesthesia Plan: General   Post-op Pain Management:    Induction: Intravenous, Rapid sequence and Cricoid pressure planned  Airway Management Planned: Oral ETT  Additional Equipment:   Intra-op Plan:   Post-operative Plan: Extubation in OR  Informed Consent: I have reviewed the patients History and Physical, chart, labs and discussed the procedure including the risks, benefits and alternatives for the proposed anesthesia with the patient or authorized representative who has indicated his/her understanding and acceptance.     Plan Discussed with:   Anesthesia Plan Comments:         Anesthesia Quick Evaluation

## 2013-10-12 NOTE — Progress Notes (Signed)
cc'd to pcp 

## 2013-10-12 NOTE — Anesthesia Procedure Notes (Signed)
Procedure Name: Intubation Date/Time: 10/12/2013 11:42 AM Performed by: Andree Elk, AMY A Pre-anesthesia Checklist: Patient identified, Patient being monitored, Timeout performed, Emergency Drugs available and Suction available Patient Re-evaluated:Patient Re-evaluated prior to inductionOxygen Delivery Method: Circle System Utilized Preoxygenation: Pre-oxygenation with 100% oxygen Intubation Type: IV induction, Rapid sequence and Cricoid Pressure applied Laryngoscope Size: 3 and Miller Grade View: Grade I Tube type: Oral Tube size: 7.0 mm Number of attempts: 1 Airway Equipment and Method: stylet Placement Confirmation: ETT inserted through vocal cords under direct vision,  positive ETCO2 and breath sounds checked- equal and bilateral Secured at: 21 cm Tube secured with: Tape Dental Injury: Teeth and Oropharynx as per pre-operative assessment

## 2013-10-16 ENCOUNTER — Encounter (HOSPITAL_COMMUNITY): Payer: Self-pay | Admitting: Internal Medicine

## 2013-10-16 LAB — HEPATIC FUNCTION PANEL
ALT: 127 U/L — ABNORMAL HIGH (ref 0–53)
AST: 139 U/L — AB (ref 0–37)
Albumin: 2.7 g/dL — ABNORMAL LOW (ref 3.5–5.2)
Alkaline Phosphatase: 553 U/L — ABNORMAL HIGH (ref 39–117)
Bilirubin, Direct: 2.7 mg/dL — ABNORMAL HIGH (ref 0.0–0.3)
Indirect Bilirubin: 2 mg/dL — ABNORMAL HIGH (ref 0.2–1.2)
TOTAL PROTEIN: 6.4 g/dL (ref 6.0–8.3)
Total Bilirubin: 4.7 mg/dL — ABNORMAL HIGH (ref 0.2–1.2)

## 2013-10-16 NOTE — Progress Notes (Signed)
Quick Note:  Tbili improved since stent placement on 6/25.  Let's get update on patient. ______

## 2013-10-17 NOTE — Progress Notes (Signed)
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   ______

## 2013-11-06 ENCOUNTER — Encounter (HOSPITAL_COMMUNITY): Payer: Self-pay

## 2013-11-16 ENCOUNTER — Encounter (HOSPITAL_COMMUNITY): Payer: Self-pay

## 2013-11-16 ENCOUNTER — Encounter (HOSPITAL_COMMUNITY): Payer: 59 | Attending: Hematology and Oncology

## 2013-11-16 VITALS — BP 131/84 | HR 90 | Temp 98.1°F | Resp 18 | Wt 213.1 lb

## 2013-11-16 DIAGNOSIS — C50919 Malignant neoplasm of unspecified site of unspecified female breast: Secondary | ICD-10-CM | POA: Diagnosis not present

## 2013-11-16 DIAGNOSIS — Z79899 Other long term (current) drug therapy: Secondary | ICD-10-CM | POA: Insufficient documentation

## 2013-11-16 DIAGNOSIS — E119 Type 2 diabetes mellitus without complications: Secondary | ICD-10-CM | POA: Diagnosis not present

## 2013-11-16 DIAGNOSIS — C3410 Malignant neoplasm of upper lobe, unspecified bronchus or lung: Secondary | ICD-10-CM | POA: Diagnosis present

## 2013-11-16 DIAGNOSIS — Z87891 Personal history of nicotine dependence: Secondary | ICD-10-CM | POA: Insufficient documentation

## 2013-11-16 DIAGNOSIS — K3184 Gastroparesis: Secondary | ICD-10-CM | POA: Diagnosis not present

## 2013-11-16 DIAGNOSIS — Z9089 Acquired absence of other organs: Secondary | ICD-10-CM | POA: Diagnosis not present

## 2013-11-16 DIAGNOSIS — C221 Intrahepatic bile duct carcinoma: Secondary | ICD-10-CM | POA: Insufficient documentation

## 2013-11-16 DIAGNOSIS — C3411 Malignant neoplasm of upper lobe, right bronchus or lung: Secondary | ICD-10-CM

## 2013-11-16 DIAGNOSIS — R188 Other ascites: Secondary | ICD-10-CM | POA: Insufficient documentation

## 2013-11-16 DIAGNOSIS — C779 Secondary and unspecified malignant neoplasm of lymph node, unspecified: Secondary | ICD-10-CM | POA: Diagnosis not present

## 2013-11-16 DIAGNOSIS — I10 Essential (primary) hypertension: Secondary | ICD-10-CM | POA: Insufficient documentation

## 2013-11-16 LAB — CBC WITH DIFFERENTIAL/PLATELET
BASOS ABS: 0 10*3/uL (ref 0.0–0.1)
Basophils Relative: 0 % (ref 0–1)
EOS ABS: 0.1 10*3/uL (ref 0.0–0.7)
EOS PCT: 2 % (ref 0–5)
HCT: 44 % (ref 39.0–52.0)
Hemoglobin: 14.9 g/dL (ref 13.0–17.0)
Lymphocytes Relative: 17 % (ref 12–46)
Lymphs Abs: 0.5 10*3/uL — ABNORMAL LOW (ref 0.7–4.0)
MCH: 31.4 pg (ref 26.0–34.0)
MCHC: 33.9 g/dL (ref 30.0–36.0)
MCV: 92.6 fL (ref 78.0–100.0)
Monocytes Absolute: 0.2 10*3/uL (ref 0.1–1.0)
Monocytes Relative: 7 % (ref 3–12)
Neutro Abs: 2.4 10*3/uL (ref 1.7–7.7)
Neutrophils Relative %: 75 % (ref 43–77)
PLATELETS: ADEQUATE 10*3/uL (ref 150–400)
RBC: 4.75 MIL/uL (ref 4.22–5.81)
RDW: 15.5 % (ref 11.5–15.5)
WBC: 3.2 10*3/uL — AB (ref 4.0–10.5)

## 2013-11-16 LAB — COMPREHENSIVE METABOLIC PANEL
ALT: 41 U/L (ref 0–53)
AST: 51 U/L — AB (ref 0–37)
Albumin: 2.8 g/dL — ABNORMAL LOW (ref 3.5–5.2)
Alkaline Phosphatase: 343 U/L — ABNORMAL HIGH (ref 39–117)
Anion gap: 10 (ref 5–15)
BUN: 15 mg/dL (ref 6–23)
CALCIUM: 9 mg/dL (ref 8.4–10.5)
CO2: 25 mEq/L (ref 19–32)
Chloride: 105 mEq/L (ref 96–112)
Creatinine, Ser: 0.78 mg/dL (ref 0.50–1.35)
GFR calc Af Amer: >90 mL/min (ref >90)
GFR calc non Af Amer: >90 mL/min (ref >90)
Glucose, Bld: 183 mg/dL — ABNORMAL HIGH (ref 70–99)
Potassium: 4 mEq/L (ref 3.7–5.3)
SODIUM: 140 meq/L (ref 137–147)
TOTAL PROTEIN: 6.7 g/dL (ref 6.0–8.3)
Total Bilirubin: 1.3 mg/dL — ABNORMAL HIGH (ref 0.3–1.2)

## 2013-11-16 LAB — LACTATE DEHYDROGENASE: LDH: 171 U/L (ref 94–250)

## 2013-11-16 LAB — AMMONIA: Ammonia: 27 umol/L (ref 11–60)

## 2013-11-16 MED ORDER — OMEPRAZOLE 20 MG PO CPDR: DELAYED_RELEASE_CAPSULE | ORAL | Status: DC

## 2013-11-16 NOTE — Patient Instructions (Addendum)
[place] Discharge Instructions  RECOMMENDATIONS MADE BY THE CONSULTANT AND ANY TEST RESULTS WILL BE SENT TO YOUR REFERRING PHYSICIAN.  EXAM FINDINGS BY THE PHYSICIAN TODAY AND SIGNS OR SYMPTOMS TO REPORT TO CLINIC OR PRIMARY PHYSICIAN: Exam and findings as discussed by Dr. Barnet Glasgow.  Will try chemotherapy with Cisplatin and Gemzar Day 1 and Day 8 every 21 days and Afatinib orally on daily basis.  Will schedule chemotherapy teaching with Lupita Raider our nurse navigator.  MEDICATIONS PRESCRIBED:  Prilosec - take as directed Reglan - take 5 mg or 10 mg before meals and before bedtime  INSTRUCTIONS/FOLLOW-UP: Chemo teaching on Tuesday MD will check on you while you are being treated on 8/6 and follow-up on 8/13.  Thank you for choosing [facility] to provide your oncology and hematology care.  To afford each patient quality time with our providers, please arrive at least 15 minutes before your scheduled appointment time.  With your help, our goal is to use those 15 minutes to complete the necessary work-up to ensure our physicians have the information they need to help with your evaluation and healthcare recommendations.    Effective January 1st, 2014, we ask that you re-schedule your appointment with our physicians should you arrive 10 or more minutes late for your appointment.  We strive to give you quality time with our providers, and arriving late affects you and other patients whose appointments are after yours.    Again, thank you for choosing [facility].  Our hope is that these requests will decrease the amount of time that you wait before being seen by our physicians.       _____________________________________________________________  Should you have questions after your visit to [facility], please contact our office at [phone] between the hours of 8:30 a.m. and 4:30 p.m.  Voicemails left after 4:30 p.m. will not be  returned until the following business day.  For prescription refill requests, have your pharmacy contact our office with your prescription refill request.    _______________________________________________________________  We hope that we have given you very good care.  You may receive a patient satisfaction survey in the mail, please complete it and return it as soon as possible.  We value your feedback!  _______________________________________________________________  Have you asked about our STAR program?  STAR stands for Survivorship Training and Rehabilitation, and this is a nationally recognized cancer care program that focuses on survivorship and rehabilitation.  Cancer and cancer treatments may cause problems, such as, pain, making you feel tired and keeping you from doing the things that you need or want to do. Cancer rehabilitation can help. Our goal is to reduce these troubling effects and help you have the best quality of life possible.  You may receive a survey from a nurse that asks questions about your current state of health.  Based on the survey results, all eligible patients will be referred to the [facility] program for an evaluation so we can better serve you!  A frequently asked questions sheet is available upon request. Gemcitabine injection What is this medicine? GEMCITABINE (jem SIT a been) is a chemotherapy drug. This medicine is used to treat many types of cancer like breast cancer, lung cancer, pancreatic cancer, and ovarian cancer. This medicine may be used for other purposes; ask your health care provider or pharmacist if you have questions. COMMON BRAND NAME(S): Gemzar What should I tell my health care provider before I take this  medicine? They need to know if you have any of these conditions: -blood disorders -infection -kidney disease -liver disease -recent or ongoing radiation therapy -an unusual or allergic reaction to gemcitabine, other chemotherapy, other  medicines, foods, dyes, or preservatives -pregnant or trying to get pregnant -breast-feeding How should I use this medicine? This drug is given as an infusion into a vein. It is administered in a hospital or clinic by a specially trained health care professional. Talk to your pediatrician regarding the use of this medicine in children. Special care may be needed. Overdosage: If you think you have taken too much of this medicine contact a poison control center or emergency room at once. NOTE: This medicine is only for you. Do not share this medicine with others. What if I miss a dose? It is important not to miss your dose. Call your doctor or health care professional if you are unable to keep an appointment. What may interact with this medicine? -medicines to increase blood counts like filgrastim, pegfilgrastim, sargramostim -some other chemotherapy drugs like cisplatin -vaccines Talk to your doctor or health care professional before taking any of these medicines: -acetaminophen -aspirin -ibuprofen -ketoprofen -naproxen This list may not describe all possible interactions. Give your health care provider a list of all the medicines, herbs, non-prescription drugs, or dietary supplements you use. Also tell them if you smoke, drink alcohol, or use illegal drugs. Some items may interact with your medicine. What should I watch for while using this medicine? Visit your doctor for checks on your progress. This drug may make you feel generally unwell. This is not uncommon, as chemotherapy can affect healthy cells as well as cancer cells. Report any side effects. Continue your course of treatment even though you feel ill unless your doctor tells you to stop. In some cases, you may be given additional medicines to help with side effects. Follow all directions for their use. Call your doctor or health care professional for advice if you get a fever, chills or sore throat, or other symptoms of a cold or  flu. Do not treat yourself. This drug decreases your body's ability to fight infections. Try to avoid being around people who are sick. This medicine may increase your risk to bruise or bleed. Call your doctor or health care professional if you notice any unusual bleeding. Be careful brushing and flossing your teeth or using a toothpick because you may get an infection or bleed more easily. If you have any dental work done, tell your dentist you are receiving this medicine. Avoid taking products that contain aspirin, acetaminophen, ibuprofen, naproxen, or ketoprofen unless instructed by your doctor. These medicines may hide a fever. Women should inform their doctor if they wish to become pregnant or think they might be pregnant. There is a potential for serious side effects to an unborn child. Talk to your health care professional or pharmacist for more information. Do not breast-feed an infant while taking this medicine. What side effects may I notice from receiving this medicine? Side effects that you should report to your doctor or health care professional as soon as possible: -allergic reactions like skin rash, itching or hives, swelling of the face, lips, or tongue -low blood counts - this medicine may decrease the number of white blood cells, red blood cells and platelets. You may be at increased risk for infections and bleeding. -signs of infection - fever or chills, cough, sore throat, pain or difficulty passing urine -signs of decreased platelets or bleeding -  bruising, pinpoint red spots on the skin, black, tarry stools, blood in the urine -signs of decreased red blood cells - unusually weak or tired, fainting spells, lightheadedness -breathing problems -chest pain -mouth sores -nausea and vomiting -pain, swelling, redness at site where injected -pain, tingling, numbness in the hands or feet -stomach pain -swelling of ankles, feet, hands -unusual bleeding Side effects that usually do  not require medical attention (report to your doctor or health care professional if they continue or are bothersome): -constipation -diarrhea -hair loss -loss of appetite -stomach upset This list may not describe all possible side effects. Call your doctor for medical advice about side effects. You may report side effects to FDA at 1-800-FDA-1088. Where should I keep my medicine? This drug is given in a hospital or clinic and will not be stored at home. NOTE: This sheet is a summary. It may not cover all possible information. If you have questions about this medicine, talk to your doctor, pharmacist, or health care provider.  2015, Elsevier/Gold Standard. (2007-08-16 18:45:54) Cisplatin injection What is this medicine? CISPLATIN (SIS pla tin) is a chemotherapy drug. It targets fast dividing cells, like cancer cells, and causes these cells to die. This medicine is used to treat many types of cancer like bladder, ovarian, and testicular cancers. This medicine may be used for other purposes; ask your health care provider or pharmacist if you have questions. COMMON BRAND NAME(S): Platinol, Platinol -AQ What should I tell my health care provider before I take this medicine? They need to know if you have any of these conditions: -blood disorders -hearing problems -kidney disease -recent or ongoing radiation therapy -an unusual or allergic reaction to cisplatin, carboplatin, other chemotherapy, other medicines, foods, dyes, or preservatives -pregnant or trying to get pregnant -breast-feeding How should I use this medicine? This drug is given as an infusion into a vein. It is administered in a hospital or clinic by a specially trained health care professional. Talk to your pediatrician regarding the use of this medicine in children. Special care may be needed. Overdosage: If you think you have taken too much of this medicine contact a poison control center or emergency room at once. NOTE: This  medicine is only for you. Do not share this medicine with others. What if I miss a dose? It is important not to miss a dose. Call your doctor or health care professional if you are unable to keep an appointment. What may interact with this medicine? -dofetilide -foscarnet -medicines for seizures -medicines to increase blood counts like filgrastim, pegfilgrastim, sargramostim -probenecid -pyridoxine used with altretamine -rituximab -some antibiotics like amikacin, gentamicin, neomycin, polymyxin B, streptomycin, tobramycin -sulfinpyrazone -vaccines -zalcitabine Talk to your doctor or health care professional before taking any of these medicines: -acetaminophen -aspirin -ibuprofen -ketoprofen -naproxen This list may not describe all possible interactions. Give your health care provider a list of all the medicines, herbs, non-prescription drugs, or dietary supplements you use. Also tell them if you smoke, drink alcohol, or use illegal drugs. Some items may interact with your medicine. What should I watch for while using this medicine? Your condition will be monitored carefully while you are receiving this medicine. You will need important blood work done while you are taking this medicine. This drug may make you feel generally unwell. This is not uncommon, as chemotherapy can affect healthy cells as well as cancer cells. Report any side effects. Continue your course of treatment even though you feel ill unless your doctor tells you to  stop. In some cases, you may be given additional medicines to help with side effects. Follow all directions for their use. Call your doctor or health care professional for advice if you get a fever, chills or sore throat, or other symptoms of a cold or flu. Do not treat yourself. This drug decreases your body's ability to fight infections. Try to avoid being around people who are sick. This medicine may increase your risk to bruise or bleed. Call your doctor or  health care professional if you notice any unusual bleeding. Be careful brushing and flossing your teeth or using a toothpick because you may get an infection or bleed more easily. If you have any dental work done, tell your dentist you are receiving this medicine. Avoid taking products that contain aspirin, acetaminophen, ibuprofen, naproxen, or ketoprofen unless instructed by your doctor. These medicines may hide a fever. Do not become pregnant while taking this medicine. Women should inform their doctor if they wish to become pregnant or think they might be pregnant. There is a potential for serious side effects to an unborn child. Talk to your health care professional or pharmacist for more information. Do not breast-feed an infant while taking this medicine. Drink fluids as directed while you are taking this medicine. This will help protect your kidneys. Call your doctor or health care professional if you get diarrhea. Do not treat yourself. What side effects may I notice from receiving this medicine? Side effects that you should report to your doctor or health care professional as soon as possible: -allergic reactions like skin rash, itching or hives, swelling of the face, lips, or tongue -signs of infection - fever or chills, cough, sore throat, pain or difficulty passing urine -signs of decreased platelets or bleeding - bruising, pinpoint red spots on the skin, black, tarry stools, nosebleeds -signs of decreased red blood cells - unusually weak or tired, fainting spells, lightheadedness -breathing problems -changes in hearing -gout pain -low blood counts - This drug may decrease the number of white blood cells, red blood cells and platelets. You may be at increased risk for infections and bleeding. -nausea and vomiting -pain, swelling, redness or irritation at the injection site -pain, tingling, numbness in the hands or feet -problems with balance, movement -trouble passing urine or  change in the amount of urine Side effects that usually do not require medical attention (report to your doctor or health care professional if they continue or are bothersome): -changes in vision -loss of appetite -metallic taste in the mouth or changes in taste This list may not describe all possible side effects. Call your doctor for medical advice about side effects. You may report side effects to FDA at 1-800-FDA-1088. Where should I keep my medicine? This drug is given in a hospital or clinic and will not be stored at home. NOTE: This sheet is a summary. It may not cover all possible information. If you have questions about this medicine, talk to your doctor, pharmacist, or health care provider.  2015, Elsevier/Gold Standard. (2007-07-12 14:40:54) Afatinib tablets What is this medicine? AFATINIB (a FA ti nib) is a chemotherapy drug. It targets a specific protein within cancer cells and stops the cancer cells from growing. This medicine is used to treat non-small cell lung cancer. This medicine may be used for other purposes; ask your health care provider or pharmacist if you have questions. COMMON BRAND NAME(S): GILOTRIF What should I tell my health care provider before I take this medicine? They need to  know if you have any of these conditions: -eye disease, vision problems, or if you wear contact lenses -heart disease -kidney disease -liver disease -lung or breathing disease -an unusual or allergic reaction to afatinib, other medicines, foods, dyes, or preservatives -pregnant or trying to get pregnant -breast-feeding How should I use this medicine? Take by mouth with a glass of water. Follow the directions on the prescription label. Take this medicine on an empty stomach, at least 1 hour before or 2 hours after food. Do not take with food. Use exactly as directed. Take your medicine at regular intervals. Do not take your medicine more often than directed. Talk to your pediatrician  regarding the use of this medicine in children. Special care may be needed. Overdosage: If you think you've taken too much of this medicine contact a poison control center or emergency room at once. Overdosage: If you think you have taken too much of this medicine contact a poison control center or emergency room at once. NOTE: This medicine is only for you. Do not share this medicine with others. What if I miss a dose? Take your missed dose as soon as you remember. If your next dose is to be taken in less than 12 hours, then do not take the missed dose. Take the next dose at your regular time. Do not take double or extra doses. What may interact with this medicine? -amiodarone -carbamazepine -certain medicines for fungal infections like ketoconazole and itraconazole -cyclosporine A -erythromycin -grapefruit juice -nelfinavir -phenobarbital -phenytoin -quinidine -rifampicin -ritonavir -saquinavir -St. John's wort -tacrolimus -verapamil This list may not describe all possible interactions. Give your health care provider a list of all the medicines, herbs, non-prescription drugs, or dietary supplements you use. Also tell them if you smoke, drink alcohol, or use illegal drugs. Some items may interact with your medicine. What should I watch for while using this medicine? Visit your doctor for regular check ups. Report any side effects. Continue your course of treatment unless your doctor tells you to stop. You will need blood work done while you are taking this medicine. If you experience any of the following, contact your health care provider: eye irritation; rash; severe or continuing diarrhea, nausea, decreased appetite, or vomiting; or if your breathing gets worse or you develop shortness of breath or cough. Do not become pregnant while taking this medicine or for 2 weeks after stopping it. Women should inform their doctor if they wish to become pregnant or think they might be pregnant.  There is a potential for serious side effects to an unborn child. Talk to your health care professional or pharmacist for more information. Do not breast-feed an infant while taking this medicine. What side effects may I notice from receiving this medicine? Side effects that you should report to your doctor or health care professional as soon as possible: -allergic reactions like skin rash, itching or hives, swelling of the face, lips, or tongue -bloody or black tarry stools -eye irritation -eye pain -fever -mouth sores -problems related to breathing including shortness of breath or cough -severe or persistent diarrhea, nausea, vomiting, or loss of appetite -spitting up blood or brown material that looks like coffee grounds -unusual bleeding or bruising Side effects that usually do not require medical attention (Report these to your doctor or health care professional if they continue or are bothersome.): -acne -diarrhea -dry skin -itching -loss of appetite -nausea -weak or tired -weight loss This list may not describe all possible side effects. Call your  doctor for medical advice about side effects. You may report side effects to FDA at 1-800-FDA-1088. Where should I keep my medicine? Keep out of the reach of children. Store between 20 and 25 degrees C (68 and 77 degrees F). Throw away any unused medicine after the expiration date. NOTE: This sheet is a summary. It may not cover all possible information. If you have questions about this medicine, talk to your doctor, pharmacist, or health care provider.  2015, Elsevier/Gold Standard. (2011-11-26 14:25:02)

## 2013-11-16 NOTE — Progress Notes (Signed)
[name]  OFFICE PROGRESS NOTE  [name], MD [name]  DIAGNOSIS: Malignant neoplasm of upper lobe of right lung - Plan: CBC with Differential, Comprehensive metabolic panel, Lactate dehydrogenase, Ammonia, CBC with Differential, Comprehensive metabolic panel, Lactate dehydrogenase, Ammonia  Intrahepatic cholangiocarcinoma  Ascites  No chief complaint on file.   CURRENT THERAPY: SBRT to lung primary adenocarcinoma. Replacement of biliary stent on 10/12/2013  INTERVAL HISTORY: Bryce Daugherty 68 y.o. male returns for followup of cholangiocarcinoma with periportal lymphadenopathy in addition to separate adenocarcinoma lung , the latter treated with SBRT x3 on 10/02/2013, 10/04/2013, and 10/05/2013., status post biliary tract stenting with replacement of stent most recently on 10/12/2013 after recurrence of jaundice. Currently the patient has clear urine with normal bowel movements. He has slight nausea with abdominal distention but no vomiting. He denies any cough, chest pain, shortness of breath, but is fatigued without fever, night sweats, or worsening lower extremity swelling or redness. He denies any skin rash, pruritus, joint pain, bone pain, headache, or seizures.  MEDICAL HISTORY: Past Medical History  Diagnosis Date  . Hypertension   . Chronic kidney disease   . Arthritis     lower back, hips knees  . Kidney stones     bilateral stones pain since 04/11/11  . Diabetes mellitus     treatment for 2 years  . 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 Adenocarcinoma, lung     s/p radiation treatments    INTERIM HISTORY: has Ureteral stone; Dilation of biliary tract; Jaundice; Loss of weight; Obstructive jaundice; Type II or unspecified type diabetes mellitus without mention of complication, not stated as uncontrolled; Unspecified essential hypertension; Lung cancer, upper lobe; Intrahepatic  cholangiocarcinoma; and Ascites on his problem list.   Progress Notes  [name], MD - 11/07/2013 6:49 AM EDT  Bryce Daugherty returns today to further discuss results of his CT scan of the chest, abdomen and pelvis. I have independently reviewed these. I have discussed these with my colleague, Dr. [name] Mech, and the findings concerning on multiple fronts. I have released them to the patient and his family on Wednesday of this week and he and his daughter, who is a Software engineer, have had a chance to review these findings. I had discussed this with his wife prior to arranging an appointment today with his extended family so that we could discuss these issues in person.  The abdominal findings include new ascites and a question of whether there would be peritoneal disease. There is progression of the disease in the liver with what appears to be invasion of the hepatic venous drainage in a fashion that might necessitate IVC clamping exclusion and repair of the IVC for any sort of resection. There also appears to be progression down the bile duct in a fashion that would call in to play the right hepatic ductal system to a greater degree. In addition to these concerning findings in the abdomen, there appears to be progression of disease in the chest with new pulmonary nodules and an increase in size and previously PET avid nodule. This is after treatment for the index, long lesion, which appears to have gone without complication. The patient, I, and his family have discussed my belief that we do not have a situation here for which local regional treatment would add value. We have discussed the risk profile of surgical resection of the liver as being excessive at  the present moment in this setting and surgery is not indicated. We have discussed the interventional local regional treatment of the liver, for which the patient was previously evaluated by Dr. Laurence Ferrari. I have indicated to the patient and his family  that I do not believe any specific local regional treatment would be therapeutically effective at this point even if the risk profile is lower. We have discussed the options of systemic treatment versus treatment with comfort and supportive care. I have reached out to Dr. Idamae Lusher office today and discussed this situation with his 14 assistant in Dr. Idamae Lusher absence and conveyed this to the patient. We have discussed the challenges of determining whether we continue to feel there are two separate lesions, one lung primary and one liver and, if so, which one one would treat with systemic chemotherapy or whether we perceive despite pathological review that these may all be linked to the same malignancy. Dr. Idamae Lusher office graciously agreed to help connect the patient with hospice in their community to evaluate as well. I have indicated to the patient and his family that I would be more than happy to see him on a regular basis and serve as a resource or backup but that his time is, in my opinion, the most valuable quality here, and I do not feel he obligatorily has to come for appointments here. We have discussed the natural history of this situation. Greater than an hour has been spent in discussion and I believe he and all of his family have an excellent understanding of this difficult and challenging situation. We remain committed to seeking to help him in any way possible.   ALLERGIES:  has No Known Allergies.  MEDICATIONS: has a current medication list which includes the following prescription(s): acetaminophen, aspirin ec, atenolol, doxazosin, glipizide, metformin, metoclopramide, and omeprazole.  SURGICAL HISTORY:  Past Surgical History  Procedure Laterality Date  . Cystectomy  under left arm  . Colonoscopy  03/24/2011    [id number] hemorrhoids  . Cholecystectomy      1984  . Catarac both eyes    . Tonsillectomy    . Ercp N/A 08/03/2013    Dr. Gala Romney: with sphincterotomy,  balloon dilation, stone extraction, plastic stent placement. Tbili 26 pre-operatively  . Sphincterotomy N/A 08/03/2013    Procedure: SPHINCTEROTOMY;  Surgeon: Daneil Dolin, MD;  Location: [facility];  Service: Endoscopy;  Laterality: N/A;  with dilation   . Biliary stent placement N/A 08/03/2013    Procedure: BILIARY STENT PLACEMENT;  Surgeon: Daneil Dolin, MD;  Location: [facility];  Service: Endoscopy;  Laterality: N/A;  . Removal of stones N/A 08/03/2013    Procedure: REMOVAL OF STONES;  Surgeon: Daneil Dolin, MD;  Location: [facility];  Service: Endoscopy;  Laterality: N/A;  . Ercp N/A 10/12/2013    Procedure: ENDOSCOPIC RETROGRADE CHOLANGIOPANCREATOGRAPHY (ERCP);  Surgeon: Daneil Dolin, MD;  Location: [facility];  Service: Endoscopy;  Laterality: N/A;  . Biliary stent placement N/A 10/12/2013    Procedure: BILIARY STENT REPLACEMENT;  Surgeon: Daneil Dolin, MD;  Location: [facility];  Service: Endoscopy;  Laterality: N/A;  . Lung biopsy  08/31/13    FAMILY HISTORY: family history includes Heart attack in his father; Pancreatic cancer in his mother. There is no history of Colon cancer.  SOCIAL HISTORY:  reports that he quit smoking about 25 years ago. His smoking use included Cigarettes. He has a 20 pack-year smoking history. He has never used smokeless tobacco. He reports that he  does not drink alcohol or use illicit drugs.  REVIEW OF SYSTEMS:  Other than that discussed above is noncontributory.  PHYSICAL EXAMINATION: ECOG PERFORMANCE STATUS: 1 - Symptomatic but completely ambulatory  Blood pressure 131/84, pulse 90, temperature 98.1 F (36.7 C), temperature source Oral, resp. rate 18, weight 213 lb 1.6 oz (96.662 kg), SpO2 99.00%.  GENERAL:alert, no distress and comfortable. M male pattern baldness. SKIN: skin color, texture, turgor are normal, no rashes or significant lesions EYES: PERLA; Conjunctiva are pink and non-injected, sclera clear SINUSES: No redness or tenderness over maxillary or  ethmoid sinuses OROPHARYNX:no exudate, no erythema on lips, buccal mucosa, or tongue. NECK: supple, thyroid normal size, non-tender, without nodularity. No masses CHEST: Increased AP diameter with no gynecomastia. LYMPH:  no palpable lymphadenopathy in the cervical, axillary or inguinal LUNGS: clear to auscultation and percussion with normal breathing effort HEART: regular rate & rhythm and no murmurs. ABDOMEN:abdomen soft, non-tender and normal bowel sounds positive slight distention with a positive fluid wave. MUSCULOSKELETAL:no cyanosis of digits and no clubbing. Range of motion normal.  NEURO: alert & oriented x 3 with fluent speech, no focal motor/sensory deficits. No evidence of asterixis.   LABORATORY DATA: Office Visit on 11/16/2013  Component Date Value Ref Range Status  . WBC 11/16/2013 3.2* 4.0 - 10.5 K/uL Final  . RBC 11/16/2013 4.75  4.22 - 5.81 MIL/uL Final  . Hemoglobin 11/16/2013 14.9  13.0 - 17.0 g/dL Final  . HCT 11/16/2013 44.0  39.0 - 52.0 % Final  . MCV 11/16/2013 92.6  78.0 - 100.0 fL Final  . MCH 11/16/2013 31.4  26.0 - 34.0 pg Final  . MCHC 11/16/2013 33.9  30.0 - 36.0 g/dL Final  . RDW 11/16/2013 15.5  11.5 - 15.5 % Final  . Platelets 11/16/2013 PLATELET CLUMPS NOTED ON SMEAR, COUNT APPEARS ADEQUATE  150 - 400 K/uL Final  . Neutrophils Relative % 11/16/2013 75  43 - 77 % Final  . Neutro Abs 11/16/2013 2.4  1.7 - 7.7 K/uL Final  . Lymphocytes Relative 11/16/2013 17  12 - 46 % Final  . Lymphs Abs 11/16/2013 0.5* 0.7 - 4.0 K/uL Final  . Monocytes Relative 11/16/2013 7  3 - 12 % Final  . Monocytes Absolute 11/16/2013 0.2  0.1 - 1.0 K/uL Final  . Eosinophils Relative 11/16/2013 2  0 - 5 % Final  . Eosinophils Absolute 11/16/2013 0.1  0.0 - 0.7 K/uL Final  . Basophils Relative 11/16/2013 0  0 - 1 % Final  . Basophils Absolute 11/16/2013 0.0  0.0 - 0.1 K/uL Final  . Sodium 11/16/2013 140  137 - 147 mEq/L Final  . Potassium 11/16/2013 4.0  3.7 - 5.3 mEq/L Final  .  Chloride 11/16/2013 105  96 - 112 mEq/L Final  . CO2 11/16/2013 25  19 - 32 mEq/L Final  . Glucose, Bld 11/16/2013 183* 70 - 99 mg/dL Final  . BUN 11/16/2013 15  6 - 23 mg/dL Final  . Creatinine, Ser 11/16/2013 0.78  0.50 - 1.35 mg/dL Final  . Calcium 11/16/2013 9.0  8.4 - 10.5 mg/dL Final  . Total Protein 11/16/2013 6.7  6.0 - 8.3 g/dL Final  . Albumin 11/16/2013 2.8* 3.5 - 5.2 g/dL Final  . AST 11/16/2013 51* 0 - 37 U/L Final  . ALT 11/16/2013 41  0 - 53 U/L Final  . Alkaline Phosphatase 11/16/2013 343* 39 - 117 U/L Final  . Total Bilirubin 11/16/2013 1.3* 0.3 - 1.2 mg/dL Final  . GFR calc non  Af Amer 11/16/2013 >90  >90 mL/min Final  . GFR calc Af Amer 11/16/2013 >90  >90 mL/min Final   Comment: (NOTE)                          The eGFR has been calculated using the CKD EPI equation.                          This calculation has not been validated in all clinical situations.                          eGFR's persistently <90 mL/min signify possible Chronic Kidney                          Disease.  . Anion gap 11/16/2013 10  5 - 15 Final  . LDH 11/16/2013 171  94 - 250 U/L Final   SLIGHT HEMOLYSIS  . Ammonia 11/16/2013 27  11 - 60 umol/L Final    PATHOLOGY:  Lung biopsy: L858R mutation positive predictive of response to prior seeing kinase inhibitors. ROS1 and ALK mutation spelled negative.   Fine Needle Aspiration, Lung5/14/2015  [facility]  Result Narrative  ACCESSION NUMBER: [id number] RECEIVED: 08/31/2013 ORDERING PHYSICIAN: Thresa Ross , MD PATIENT NAME: Harrel Lemon CYTOLOGY REPORT  Final Cytologic Interpretation A.  Lung, RUL, Fine Needle Aspiration II (smears and cell block):      Adenocarcinoma, most consistent with a lung primary.            Specimen Adequacy:  Satisfactory for evaluation.  B.  Lung , RUL, Core Biopsy:      Adenocarcinoma, most consistent with a lung primary.      See comment.       Specimen Adequacy:   Satisfactory for evaluation.    COMMENT: The aspirates show malignant epithelial cells with high N/C ratios, eccentrically placed nuclei and moderate cytoplasm arranged in clusters and singly.  The core biopsy show a moderate to well differentiated adenocarcinoma. Immunoperoxidase studies performed on the core biopsy material show that the neoplastic cells stain positively for TTF-1, Napsin-A and cytokeratin 7 with negative staining for cytokeratin 20 and CA19-9.  Together with the cytomorphology these findings support the diagnosis of adenocarcinoma and overall the immunophenotype is most consistent with a lung primary over a metastasis. Clinical correlation advised.  Ancillary testing for ALK, ROS and EGFR have been ordered and will be reported separately.  The positive immunohistochemical controls worked appropriately.  "These tests were developed and their performance characteristics determined by [facility].  They have not been cleared or approved by the U.S. Food and Drug Administration.  The FDA has determined that such clearance or approval is not necessary.  These tests are used for clinical purposes.  They should not be regarded as investigational or for research.  This laboratory is certified under the [place] (CLIA) as qualified to perform high complexity clinical laboratory testing."   Report Prepared By:  Zada Finders, M.D., Resident-Pathology  I have personally reviewed the slides and/or other related materials referenced, and have edited the report as part of my pathologic assessment and final interpretation.  Electronically Signed Out By:   Rolland Porter D., Pathology 09/04/2013 11:08:11  sb/aej  Specimen(s) Received: A:  Lung, Fine Needle Aspiration  II (smears and cell block)  B:  Cytology core biopsy   Clinical History RUL nodule H/O  cholangiocarcinoma per [facility].  Preliminary (on-site) Interpretation Adequate.  L Cox  08/31/13     Gross Description A: Fine Needle Aspiration II, Slides FNA x 10, Cell Block w/ H&E B: Cyto core biopsy  w/ H&E, Cell Block w/ H&E x 4, Unstained slides - immuno x 21, Cytokeratin 7, CYK 7 with Control, CYK 20, CYK 20 with Control, THYROID TRANSCRIPTION FACTOR 1, TTF-1 WITH CONTROL, NapsinA, NapsinA control, CA19-9, CA19-9 with Control  5 cc, including saline, red fluid.  Red sediment.  (Specimen processed by concentration technique). Core biopsy obtained in formalin.    Copy To: [name] , MD    Medical Director: Virgilio Frees, M.D., Laboratory Director  [place]             Date Ordered: 09/04/2013        Date Reported: 09/14/2013 Interpretation  Laboratory Analysis:  Molecular Cytogenetic Analysis:  .nuc ish (ALKx1)(5'ALKx1),(ROS1x2)  Molecular Cytogenetic Analysis:      Normal:  The investigative technique of molecular cytogenetic analysis with the DNA probe for the  ALK gene (2p23.3) revealed that the ALK gene was broken in approximately 1% of the interphase cells examined.  The % cells with ALK breaks failed to exceed the cutoff value: >15% of cells.             Note: ~30% of cells showed partial loss of ALK however this is considered within the normal range.    Molecular Cytogenetic Analysis:      Normal:  The investigative technique of molecular cytogenetic analysis with the DNA probe for the ROS1 gene (6q22) revealed that the ROS1 gene was broken in approximately 3% of the interphase cells examined.  The % cells with ROS1 breaks failed to exceed the cutoff value: >15% of cells.    Sherrie George, Ph.D. [facility] Professor, Pediatrics/Medical Genetics and Pathology Director, Cytogenetics and Molecular Cytogenetics       A normal karyotype/interpretation indicates that the patient does not have a chromosome  abnormality detectable by current techniques utilized in this study and within the limitations of the sample.  We emphasize that this does not eliminate the possibility of other birth defects, abnormalities, or mental retardation due to other causes.  Molecular cytogenetic testing was developed and its performance characteristics determined by the [facility] as required by the CLIA '88 regulations.  It has not been cleared or approved for specific uses by the U.S. Food and Drug Administration.  The FDA has determined that such clearances or approval is not necessary.  This test is used for clinical purposes.  It should not be regarded as investigational or for research.  Pursuant to the requirements of CLIA '88, this laboratory has established and verified the test's accuracy and precision.    DDG/mjp    Electronically Signed Out By:   Anne Hahn, M.D., Pathology 09/14/2013 10:33:32   Surgical Pathology Tissue Exam5/14/2015  [facility]  Specimen  Other-specify source in comments   Result Narrative  ACCESSION NUMBER: [id number] RECEIVED: 09/04/2013 ORDERING PHYSICIAN: [name] , MD PATIENT NAME: Iden, Aleksey    PCR RESULTS Specimen(s) Received:  Block for EGFR     Interpretation Mutation Positive:  A L858R mutation was detected within EGFR in [place] paraffin embedded adenocarcinoma of the right upper lung lobe tissue sample ([id number] B1, ~85% tumor).  Patients with this mutation may respond to treatment with an EGFR-directed kinase inhibitor such as Gilotrif (afatinib).   Addendum Comment       Urinalysis No results found for this basename: colorurine,  appearanceur,  labspec,  phurine,  glucoseu,  hgbur,  bilirubinur,  ketonesur,  proteinur,  urobilinogen,  nitrite,  leukocytesur    RADIOGRAPHIC STUDIES: Other Result ReportsCollapse All  2015 July Complete Pulmonary Function Test  (PFT)10/31/2013  [facility]  Complete Pulmonary Function Test (PFT)10/31/2013  [facility]  Component Name Value Range       Status Results Details   Encounter Summary      CT ABDOMjEN PELVIS W CONTRAST(PANCREAS)10/31/2013  [facility]  Result Impression    1. 9.8 x 7.3 cm infiltrating heterogeneous mass involving the entire left lobe of the liver consistent with biopsy proven cholangiocarcinoma. This mass occludes the left portal vein and encases the left hepatic artery. Mass appears to have slightly enlarged when compared to prior CT and is seen to extend into segment 8 of the liver. 2. Interval development of moderate ascites with nodularity of the omentum raising concern for peritoneal metastatic disease. 3. The tumor appears to be extending into the common hepatic duct. CBD stent in situ , extending from the CBD to the anterior division of the right hepatic duct. 4. Similar appearance of enlarged hepatoduodenal ligament lymph node, concerning for lymphatic spread. 5. 6.3 mm nonobstructing stone in the interpolar region of the left kidney. 6. Bladder calculi. Other ancillary findings as above. Please refer to the contemporaneously performed chest CT report for details of the chest findings.   Result Narrative  CT OF THE ABDOMEN AND PELVIS WITH INTRAVENOUS CONTRAST (PANCREAS PROTOCOL), Oct 31, 2013 12:21:05 PM . INDICATION:  re stage left liver cancer pre op/ s p SBRT to left lung lesion, post Rx f u of  . COMPARISON: Radiation planning CT dated 09/18/2013 , at [place] dated 08/25/2013 and an outside CT dated 08/07/2013. Marland Kitchen TECHNIQUE: After the administration of intravenous contrast, axial images of the abdomen were obtained in the arterial and portal venous phases. Axial images of the pelvis were obtained in the portal venous phase. Supplemental 2D reformatted images were generated and reviewed as needed. . LOWER  CHEST: Please refer to the contemporaneously performed chest CT report for details of the chest findings. . ABDOMEN: .  Liver: Heterogeneously enhancing infiltrating mass involving the end entire left lobe of the liver measuring approximately 9.8 x 7.3 cm.. This mass encases the the left hepatic artery and completely occludes the left portal vein. The mass appears to have slightly increased in size when compared to the outside CT dated 08/07/2013 and appears to be extending into segment 8. .  Gallbladder/biliary: The soft tissue appears to extend from the left lobe to the common hepatic duct. A CBD stent in situ with its proximal tip ending in the anterior division of the right hepatic duct. Marland Kitchen  Spleen: Spleen is enlarged measuring 17 cm.. .  Pancreas: Within normal limits. .  Adrenals: Within normal limits. .  Kidneys: 6.3 mm nonobstructing stone in the interpolar region of the left kidney. Bilateral kidneys demonstrate multiple subcentimeter low attenuating lesions which are too small to characterize.. .  Peritoneum/mesenteries: Interval development of mild to moderate ascites when compared to the outside CT dated 08/07/2013. There is mild nodularity of the omentum. Prominent hepatoduodenal ligament lymph nodes, the largest one measuring 1.9 cm (best seen on  series 2 image 52). This node has not significantly changed in size in comparison to prior CT.. .  Extraperitoneum: There are no enlarged retroperitoneal lymph nodes.. .  Gastrointestinal tract: Within normal limits. .  Vascular: Incidental note is made of a single trunk origin of celiac and SMA from the aorta.. . PELVIS: .  Peritoneum: Moderate amount of free fluid in the pelvis.. .  Extraperitoneum: Right fat and peritoneum containing inguinal hernia with free fluid within the peritoneal extension. .  Ureters: Within normal limits. .  Bladder: Two stone seen within the bladder, largest one measuring 7 mm. .  Reproductive System: Prostate  gland measures 5.3 cm in its transverse diameter consistent with prostatomegaly . There is  nonspecific prostatic calcification.. .  Vascular: Within normal limits. . MSK: Mild degenerative changes seen within the spine. There are no lytic or sclerotic lesion suspicious for neoplastic process     ASSESSMENT:  #1. 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 #2. 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 #3. Nausea secondary to gastroparesis. #4. Diabetes mellitus, type II, non-insulin requiring, controlled. #5. Hypertension, controlled.   PLAN:  #1. After careful evaluation and discussion, systemic treatment will be offered. Combination chemotherapy appropriate to treat both his lung cancer and his biliary tract cancer can be given, specifically cisplatin and Gemzar(NEJMed [phone]) with Afatinib 20 mg orally concurrently as an added intervention for his EGFR mutated lung cancer.. #2. Patient was told to take Reglan 10 mg 3 times a day before meals and possibly at bedtime as well. #3. Appropriate third-party approval will be obtained prior to initiation of therapy planned for 11/23/2013. #4. Chemotherapy teaching with nurse navigator will precede initial therapy.   All questions were answered. The patient knows to call the clinic with any problems, questions or concerns. We can certainly see the patient much sooner if necessary.   I spent 55 minutes counseling the patient face to face. The total time spent in the appointment was 60 minutes.    Doroteo Bradford, MD 11/16/2013 3:34 PM  DISCLAIMER:  This note was dictated with voice recognition software.  Similar sounding words can inadvertently be transcribed inaccurately and may not be corrected upon review.

## 2013-11-19 MED ORDER — PROCHLORPERAZINE MALEATE 10 MG PO TABS: 10.0000 mg | ORAL_TABLET | Freq: Four times a day (QID) | ORAL | Status: AC | PRN

## 2013-11-19 NOTE — Patient Instructions (Addendum)
[facility]   CHEMOTHERAPY INSTRUCTIONS  Gemcitabine - bone marrow suppression (lowers white blood cells (fight infection), lowers red blood cells (make up your blood), lowers platelets (help blood to clot). Nausea/vomiting,fever, flu-like symptoms, rash. (This medication will take 30 minutes to infuse).  Cisplatin - this medication is hard on your kidneys. This is why we give you IV fluids while you are here getting chemo. We also need you drinking 64 oz of fluid (preferably water/decaff fluids) daily. Drink more if you can. This will help keep your kidneys flushed. This can also cause acute and/or delayed nausea/vomiting. You must take your nausea meds as prescribed if you get nauseated. Do not wait until you start vomiting. This med can also cause peripheral neuropathy (numbness/tingling/burning in hands/fingers/feet/toes). Let us know if this develops so that we can monitor it and treat if necessary. (This medication takes 1 hour to infuse.)   Afatinib/Gilotrif - diarrhea, mouth sores/inflammation of the mouth/mucosal membranes, rash, acne, itching, paronychia (infection where the skin & nail meets), dry skin, decreased appetite. Diarrhea occurs in nearly all patients who receive Gilotrif. Be sure you immediately report diarrhea and seek medical attention promptly for severe or persistent diarrhea. Be sure to wear sunscreen, hats, and protective clothing to reduce your chances of burning when in the sunlight as well as worsening rash. Do not spend a lot of time in the sunlight while taking this medication. Immediately report any new or worsening lung symptoms such as trouble breathing, shortness of breath, cough, and/or fever. Immediately report any eye problems such as eye pain/swelling/redness/blurred vision. Immediately report any signs of swelling of the ankles/legs, palpitations, or sudden weight gain.   You will be responsible for administering this medication,  Gilotrif, to yourself. Take 1 tablet daily on an empty stomach (which means 1 hour before or 2 hours after a meal). Taking this medication on an empty stomach is very important. Take it at the same time everyday. If you miss your daily dose, do NOT double dose and do NOT take this medication within 12 hours of your next dose.    POTENTIAL SIDE EFFECTS OF TREATMENT: Increased Susceptibility to Infection, Vomiting, Hair Thinning, Changes in Character of Skin and Nails (brittleness, dryness,etc.), Bone Marrow Suppression, Complete Hair Loss, Nausea, Diarrhea, Sun Sensitivity and Mouth Sores   SELF IMAGE NEEDS AND REFERRALS MADE: Obtain hair accessories as soon as possible (caps,etc.)   EDUCATIONAL MATERIALS GIVEN AND REVIEWED: Chemotherapy and You booklet Specific Instructions Sheets: Gemzar, Cisplatin, Afatinib, Aloxi, Emend, Dexamethasone, port-a-cath, EMLA cream, Metoclopramide, Prochlorperazine   SELF CARE ACTIVITIES WHILE ON CHEMOTHERAPY: Increase your fluid intake 48 hours prior to treatment and drink at least 2 quarts per day after treatment., No alcohol intake., No aspirin or other medications unless approved by your oncologist., Eat foods that are light and easy to digest., Eat foods at cold or room temperature., No fried, fatty, or spicy foods immediately before or after treatment., Have teeth cleaned professionally before starting treatment. Keep dentures and partial plates clean., Use soft toothbrush and do not use mouthwashes that contain alcohol. Biotene is a good mouthwash that is available at most pharmacies or may be ordered by calling [phone]., Use warm salt water gargles (1 teaspoon salt per 1 quart warm water) before and after meals and at bedtime. Or you may rinse with 2 tablespoons of three -percent hydrogen peroxide mixed in eight ounces of water., Always use sunscreen with SPF (Sun Protection Factor) of 30 or higher.,  Use your nausea medication as directed to prevent  nausea., Use your stool softener or laxative as directed to prevent constipation. and Use your anti-diarrheal medication as directed to stop diarrhea.  Please wash your hands for at least 30 seconds using warm soapy water. Handwashing is the #1 way to prevent the spread of germs. Stay away from sick people or people who are getting over a cold. If you develop respiratory systems such as green/yellow mucus production or productive cough or persistent cough let us know and we will see if you need an antibiotic. It is a good idea to keep a pair of gloves on when going into grocery stores/[employer] to decrease your risk of coming into contact with germs on the carts, etc. Carry alcohol hand gel with you at all times and use it frequently if out in public. All foods need to be cooked thoroughly. No raw foods. No medium or undercooked meats, eggs. If your food is cooked medium well, it does not need to be hot pink or saturated with bloody liquid at all. Vegetables and fruits need to be washed/rinsed under the faucet with a dish detergent before being consumed. You can eat raw fruits and vegetables unless we tell you otherwise but it would be best if you cooked them or bought frozen. Do not eat off of salad bars or hot bars unless you really trust the cleanliness of the restaurant. If you need dental work, please let us know before you go for your appointment so that we can coordinate the best possible time for you in regards to your chemo regimen. You need to also let your dentist know that you are actively taking chemo. We may need to do labs prior to your dental appointment. We also want your bowels moving at least every other day. If this is not happening, we need to know so that we can get you on a bowel regimen to help you go.     MEDICATIONS: You have been given prescriptions for the following medications:  Reglan/metoclopramide 26m tablet. Starting the day after chemo, take 1 tablet four times a day x 48  hours. Then may take 1 tablet four times a day IF needed for nausea/vomiting.   Compazine/Prochlorperazine 134mtablet. Starting the day after chemo, take 1 tablet four times a day x 48 hours. Then may take 1 tablet four times a day IF needed for nausea/vomiting.   EMLA cream. Apply a quarter size amount to port site 1 hour prior to chemo. Do not rub in. Cover with plastic wrap. (you do not need this right now)  Over-the-Counter Meds:   Colace - this is a stool softener. Take 10032mapsule 2-6 times a day as needed. If you have to take more than 6 capsules of Colace a day call the [facility] - this is a mild laxative used to treat mild constipation. May take 2 tabs by mouth daily or up to twice a day as needed for mild constipation.  Milk of Magnesia - this is a laxative used to treat moderate to severe constipation. May take 2-4 tablespoons every 8 hours as needed. May increase to 8 tablespoons x 1 dose and if no bowel movement call the [facility] - this is for diarrhea. Take 2 tabs after 1st loose stool and then 1 tab after each loose stool until you go a total of 12 hours without a loose stool. Call [facility] loose stools continue.  SYMPTOMS TO REPORT AS SOON AS POSSIBLE AFTER TREATMENT:  FEVER GREATER THAN 100.5 F  CHILLS WITH OR WITHOUT FEVER  NAUSEA AND VOMITING THAT IS NOT CONTROLLED WITH YOUR NAUSEA MEDICATION  UNUSUAL SHORTNESS OF BREATH  UNUSUAL BRUISING OR BLEEDING  TENDERNESS IN MOUTH AND THROAT WITH OR WITHOUT PRESENCE OF ULCERS  URINARY PROBLEMS  BOWEL PROBLEMS  UNUSUAL RASH    Wear comfortable clothing and clothing appropriate for easy access to any Portacath or PICC line. Let us know if there is anything that we can do to make your therapy better!      I have been informed and understand all of the instructions given to me and have received a copy. I have been instructed to call the clinic [phone] or my family  physician as soon as possible for continued medical care, if indicated. I do not have any more questions at this time but understand that I may call the [facility] or the Patient Navigator at [phone] during office hours should I have questions or need assistance in obtaining follow-up care.      _________________________________________      _______________     __________ Signature of Patient or Authorized Representative        Date                            Time      _________________________________________ Nurse's Signature      Gemcitabine injection What is this medicine? GEMCITABINE (jem SIT a been) is a chemotherapy drug. This medicine is used to treat many types of cancer like breast cancer, lung cancer, pancreatic cancer, and ovarian cancer. This medicine may be used for other purposes; ask your health care provider or pharmacist if you have questions. COMMON BRAND NAME(S): Gemzar What should I tell my health care provider before I take this medicine? They need to know if you have any of these conditions: -blood disorders -infection -kidney disease -liver disease -recent or ongoing radiation therapy -an unusual or allergic reaction to gemcitabine, other chemotherapy, other medicines, foods, dyes, or preservatives -pregnant or trying to get pregnant -breast-feeding How should I use this medicine? This drug is given as an infusion into a vein. It is administered in a hospital or clinic by a specially trained health care professional. Talk to your pediatrician regarding the use of this medicine in children. Special care may be needed. Overdosage: If you think you have taken too much of this medicine contact a poison control center or emergency room at once. NOTE: This medicine is only for you. Do not share this medicine with others. What if I miss a dose? It is important not to miss your dose. Call your doctor or health care professional if you are unable to  keep an appointment. What may interact with this medicine? -medicines to increase blood counts like filgrastim, pegfilgrastim, sargramostim -some other chemotherapy drugs like cisplatin -vaccines Talk to your doctor or health care professional before taking any of these medicines: -acetaminophen -aspirin -ibuprofen -ketoprofen -naproxen This list may not describe all possible interactions. Give your health care provider a list of all the medicines, herbs, non-prescription drugs, or dietary supplements you use. Also tell them if you smoke, drink alcohol, or use illegal drugs. Some items may interact with your medicine. What should I watch for while using this medicine? Visit your doctor for checks on your progress. This drug may make you feel generally unwell.  This is not uncommon, as chemotherapy can affect healthy cells as well as cancer cells. Report any side effects. Continue your course of treatment even though you feel ill unless your doctor tells you to stop. In some cases, you may be given additional medicines to help with side effects. Follow all directions for their use. Call your doctor or health care professional for advice if you get a fever, chills or sore throat, or other symptoms of a cold or flu. Do not treat yourself. This drug decreases your body's ability to fight infections. Try to avoid being around people who are sick. This medicine may increase your risk to bruise or bleed. Call your doctor or health care professional if you notice any unusual bleeding. Be careful brushing and flossing your teeth or using a toothpick because you may get an infection or bleed more easily. If you have any dental work done, tell your dentist you are receiving this medicine. Avoid taking products that contain aspirin, acetaminophen, ibuprofen, naproxen, or ketoprofen unless instructed by your doctor. These medicines may hide a fever. Women should inform their doctor if they wish to become  pregnant or think they might be pregnant. There is a potential for serious side effects to an unborn child. Talk to your health care professional or pharmacist for more information. Do not breast-feed an infant while taking this medicine. What side effects may I notice from receiving this medicine? Side effects that you should report to your doctor or health care professional as soon as possible: -allergic reactions like skin rash, itching or hives, swelling of the face, lips, or tongue -low blood counts - this medicine may decrease the number of white blood cells, red blood cells and platelets. You may be at increased risk for infections and bleeding. -signs of infection - fever or chills, cough, sore throat, pain or difficulty passing urine -signs of decreased platelets or bleeding - bruising, pinpoint red spots on the skin, black, tarry stools, blood in the urine -signs of decreased red blood cells - unusually weak or tired, fainting spells, lightheadedness -breathing problems -chest pain -mouth sores -nausea and vomiting -pain, swelling, redness at site where injected -pain, tingling, numbness in the hands or feet -stomach pain -swelling of ankles, feet, hands -unusual bleeding Side effects that usually do not require medical attention (report to your doctor or health care professional if they continue or are bothersome): -constipation -diarrhea -hair loss -loss of appetite -stomach upset This list may not describe all possible side effects. Call your doctor for medical advice about side effects. You may report side effects to FDA at 1-800-FDA-1088. Where should I keep my medicine? This drug is given in a hospital or clinic and will not be stored at home. NOTE: This sheet is a summary. It may not cover all possible information. If you have questions about this medicine, talk to your doctor, pharmacist, or health care provider.  2015, Elsevier/Gold Standard. (2007-08-16  18:45:54) Cisplatin injection What is this medicine? CISPLATIN (SIS pla tin) is a chemotherapy drug. It targets fast dividing cells, like cancer cells, and causes these cells to die. This medicine is used to treat many types of cancer like bladder, ovarian, and testicular cancers. This medicine may be used for other purposes; ask your health care provider or pharmacist if you have questions. COMMON BRAND NAME(S): Platinol, Platinol -AQ What should I tell my health care provider before I take this medicine? They need to know if you have any of these conditions: -blood  disorders -hearing problems -kidney disease -recent or ongoing radiation therapy -an unusual or allergic reaction to cisplatin, carboplatin, other chemotherapy, other medicines, foods, dyes, or preservatives -pregnant or trying to get pregnant -breast-feeding How should I use this medicine? This drug is given as an infusion into a vein. It is administered in a hospital or clinic by a specially trained health care professional. Talk to your pediatrician regarding the use of this medicine in children. Special care may be needed. Overdosage: If you think you have taken too much of this medicine contact a poison control center or emergency room at once. NOTE: This medicine is only for you. Do not share this medicine with others. What if I miss a dose? It is important not to miss a dose. Call your doctor or health care professional if you are unable to keep an appointment. What may interact with this medicine? -dofetilide -foscarnet -medicines for seizures -medicines to increase blood counts like filgrastim, pegfilgrastim, sargramostim -probenecid -pyridoxine used with altretamine -rituximab -some antibiotics like amikacin, gentamicin, neomycin, polymyxin B, streptomycin, tobramycin -sulfinpyrazone -vaccines -zalcitabine Talk to your doctor or health care professional before taking any of these  medicines: -acetaminophen -aspirin -ibuprofen -ketoprofen -naproxen This list may not describe all possible interactions. Give your health care provider a list of all the medicines, herbs, non-prescription drugs, or dietary supplements you use. Also tell them if you smoke, drink alcohol, or use illegal drugs. Some items may interact with your medicine. What should I watch for while using this medicine? Your condition will be monitored carefully while you are receiving this medicine. You will need important blood work done while you are taking this medicine. This drug may make you feel generally unwell. This is not uncommon, as chemotherapy can affect healthy cells as well as cancer cells. Report any side effects. Continue your course of treatment even though you feel ill unless your doctor tells you to stop. In some cases, you may be given additional medicines to help with side effects. Follow all directions for their use. Call your doctor or health care professional for advice if you get a fever, chills or sore throat, or other symptoms of a cold or flu. Do not treat yourself. This drug decreases your body's ability to fight infections. Try to avoid being around people who are sick. This medicine may increase your risk to bruise or bleed. Call your doctor or health care professional if you notice any unusual bleeding. Be careful brushing and flossing your teeth or using a toothpick because you may get an infection or bleed more easily. If you have any dental work done, tell your dentist you are receiving this medicine. Avoid taking products that contain aspirin, acetaminophen, ibuprofen, naproxen, or ketoprofen unless instructed by your doctor. These medicines may hide a fever. Do not become pregnant while taking this medicine. Women should inform their doctor if they wish to become pregnant or think they might be pregnant. There is a potential for serious side effects to an unborn child. Talk to  your health care professional or pharmacist for more information. Do not breast-feed an infant while taking this medicine. Drink fluids as directed while you are taking this medicine. This will help protect your kidneys. Call your doctor or health care professional if you get diarrhea. Do not treat yourself. What side effects may I notice from receiving this medicine? Side effects that you should report to your doctor or health care professional as soon as possible: -allergic reactions like skin rash, itching  or hives, swelling of the face, lips, or tongue -signs of infection - fever or chills, cough, sore throat, pain or difficulty passing urine -signs of decreased platelets or bleeding - bruising, pinpoint red spots on the skin, black, tarry stools, nosebleeds -signs of decreased red blood cells - unusually weak or tired, fainting spells, lightheadedness -breathing problems -changes in hearing -gout pain -low blood counts - This drug may decrease the number of white blood cells, red blood cells and platelets. You may be at increased risk for infections and bleeding. -nausea and vomiting -pain, swelling, redness or irritation at the injection site -pain, tingling, numbness in the hands or feet -problems with balance, movement -trouble passing urine or change in the amount of urine Side effects that usually do not require medical attention (report to your doctor or health care professional if they continue or are bothersome): -changes in vision -loss of appetite -metallic taste in the mouth or changes in taste This list may not describe all possible side effects. Call your doctor for medical advice about side effects. You may report side effects to FDA at 1-800-FDA-1088. Where should I keep my medicine? This drug is given in a hospital or clinic and will not be stored at home. NOTE: This sheet is a summary. It may not cover all possible information. If you have questions about this medicine,  talk to your doctor, pharmacist, or health care provider.  2015, Elsevier/Gold Standard. (2007-07-12 14:40:54) Palonosetron Injection What is this medicine? PALONOSETRON (pal oh NOE se tron) is used to prevent nausea and vomiting caused by chemotherapy. It also helps prevent delayed nausea and vomiting that may occur a few days after your treatment. This medicine may be used for other purposes; ask your health care provider or pharmacist if you have questions. COMMON BRAND NAME(S): Aloxi What should I tell my health care provider before I take this medicine? They need to know if you have any of these conditions: -an unusual or allergic reaction to palonosetron, dolasetron, granisetron, ondansetron, other medicines, foods, dyes, or preservatives -pregnant or trying to get pregnant -breast-feeding How should I use this medicine? This medicine is for infusion into a vein. It is given by a health care professional in a hospital or clinic setting. Talk to your pediatrician regarding the use of this medicine in children. While this drug may be prescribed for children as young as 1 month for selected conditions, precautions do apply. Overdosage: If you think you have taken too much of this medicine contact a poison control center or emergency room at once. NOTE: This medicine is only for you. Do not share this medicine with others. What if I miss a dose? This does not apply. What may interact with this medicine? -certain medicines for depression, anxiety, or psychotic disturbances -fentanyl -linezolid -MAOIs like Carbex, Eldepryl, Marplan, Nardil, and Parnate -methylene blue (injected into a vein) -tramadol This list may not describe all possible interactions. Give your health care provider a list of all the medicines, herbs, non-prescription drugs, or dietary supplements you use. Also tell them if you smoke, drink alcohol, or use illegal drugs. Some items may interact with your medicine. What  should I watch for while using this medicine? Your condition will be monitored carefully while you are receiving this medicine. What side effects may I notice from receiving this medicine? Side effects that you should report to your doctor or health care professional as soon as possible: -allergic reactions like skin rash, itching or hives, swelling of the  face, lips, or tongue -breathing problems -confusion -dizziness -fast, irregular heartbeat -fever and chills -loss of balance or coordination -seizures -sweating -swelling of the hands and feet -tremors -unusually weak or tired Side effects that usually do not require medical attention (report to your doctor or health care professional if they continue or are bothersome): -constipation or diarrhea -headache This list may not describe all possible side effects. Call your doctor for medical advice about side effects. You may report side effects to FDA at 1-800-FDA-1088. Where should I keep my medicine? This drug is given in a hospital or clinic and will not be stored at home. NOTE: This sheet is a summary. It may not cover all possible information. If you have questions about this medicine, talk to your doctor, pharmacist, or health care provider.  2015, Elsevier/Gold Standard. (2013-02-10 10:38:36) Fosaprepitant injection What is this medicine? FOSAPREPITANT (fos ap RE pi tant) is used together with other medicines to prevent nausea and vomiting caused by cancer treatment (chemotherapy). This medicine may be used for other purposes; ask your health care provider or pharmacist if you have questions. COMMON BRAND NAME(S): Emend What should I tell my health care provider before I take this medicine? They need to know if you have any of these conditions: -liver disease -an unusual or allergic reaction to fosaprepitant, aprepitant, medicines, foods, dyes, or preservatives -pregnant or trying to get pregnant -breast-feeding How should  I use this medicine? This medicine is for injection into a vein. It is given by a health care professional in a hospital or clinic setting. Talk to your pediatrician regarding the use of this medicine in children. Special care may be needed. Overdosage: If you think you have taken too much of this medicine contact a poison control center or emergency room at once. NOTE: This medicine is only for you. Do not share this medicine with others. What if I miss a dose? This does not apply. What may interact with this medicine? Do not take this medicine with any of these medicines: -cisapride -pimozide -ranolazine This medicine may also interact with the following medications: -diltiazem -male hormones, like estrogens or progestins and birth control pills -medicines for fungal infections like ketoconazole and itraconazole -medicines for HIV -medicines for seizures or to control epilepsy like carbamazepine or phenytoin -medicines used for sleep or anxiety disorders like alprazolam, diazepam, or midazolam -nefazodone -paroxetine -rifampin -some chemotherapy medications like etoposide, ifosfamide, vinblastine, vincristine -some antibiotics like clarithromycin, erythromycin, troleandomycin -steroid medicines like dexamethasone or methylprednisolone -tolbutamide -warfarin This list may not describe all possible interactions. Give your health care provider a list of all the medicines, herbs, non-prescription drugs, or dietary supplements you use. Also tell them if you smoke, drink alcohol, or use illegal drugs. Some items may interact with your medicine. What should I watch for while using this medicine? Do not take this medicine if you already have nausea and vomiting. Ask your health care provider what to do if you already have nausea. Birth control pills may not work properly while you are taking this medicine. Talk to your doctor about using an extra method of birth control. This medicine  should not be used continuously for a long time. Visit your doctor or health care professional for regular check-ups. This medicine may change your liver function blood test results. What side effects may I notice from receiving this medicine? Side effects that you should report to your doctor or health care professional as soon as possible: -allergic reactions like skin rash, itching  or hives, swelling of the face, lips, or tongue -breathing problems -changes in heart rhythm -high or low blood pressure -pain, redness, or irritation at site where injected -rectal bleeding -serious dizziness or disorientation, confusion -sharp or severe stomach pain -sharp pain in your leg Side effects that usually do not require medical attention (report to your doctor or health care professional if they continue or are bothersome): -constipation or diarrhea -hair loss -headache -hiccups -loss of appetite -nausea -upset stomach -tiredness This list may not describe all possible side effects. Call your doctor for medical advice about side effects. You may report side effects to FDA at 1-800-FDA-1088. Where should I keep my medicine? This drug is given in a hospital or clinic and will not be stored at home. NOTE: This sheet is a summary. It may not cover all possible information. If you have questions about this medicine, talk to your doctor, pharmacist, or health care provider.  2015, Elsevier/Gold Standard. (2009-03-19 12:46:13) Dexamethasone injection What is this medicine? DEXAMETHASONE (dex a METH a sone) is a corticosteroid. It is used to treat inflammation of the skin, joints, lungs, and other organs. Common conditions treated include asthma, allergies, and arthritis. It is also used for other conditions, like blood disorders and diseases of the adrenal glands. This medicine may be used for other purposes; ask your health care provider or pharmacist if you have questions. COMMON BRAND NAME(S):  Decadron, Solurex What should I tell my health care provider before I take this medicine? They need to know if you have any of these conditions: -blood clotting problems -Cushing's syndrome -diabetes -glaucoma -heart problems or disease -high blood pressure -infection like herpes, measles, tuberculosis, or chickenpox -kidney disease -liver disease -mental problems -myasthenia gravis -osteoporosis -previous heart attack -seizures -stomach, ulcer or intestine disease including colitis and diverticulitis -thyroid problem -an unusual or allergic reaction to dexamethasone, corticosteroids, other medicines, lactose, foods, dyes, or preservatives -pregnant or trying to get pregnant -breast-feeding How should I use this medicine? This medicine is for injection into a muscle, joint, lesion, soft tissue, or vein. It is given by a health care professional in a hospital or clinic setting. Talk to your pediatrician regarding the use of this medicine in children. Special care may be needed. Overdosage: If you think you have taken too much of this medicine contact a poison control center or emergency room at once. NOTE: This medicine is only for you. Do not share this medicine with others. What if I miss a dose? This may not apply. If you are having a series of injections over a prolonged period, try not to miss an appointment. Call your doctor or health care professional to reschedule if you are unable to keep an appointment. What may interact with this medicine? Do not take this medicine with any of the following medications: -mifepristone, RU-486 -vaccines This medicine may also interact with the following medications: -amphotericin B -antibiotics like clarithromycin, erythromycin, and troleandomycin -aspirin and aspirin-like drugs -barbiturates like phenobarbital -carbamazepine -cholestyramine -cholinesterase inhibitors like donepezil, galantamine, rivastigmine, and  tacrine -cyclosporine -digoxin -diuretics -ephedrine -male hormones, like estrogens or progestins and birth control pills -indinavir -isoniazid -ketoconazole -medicines for diabetes -medicines that improve muscle tone or strength for conditions like myasthenia gravis -NSAIDs, medicines for pain and inflammation, like ibuprofen or naproxen -phenytoin -rifampin -thalidomide -warfarin This list may not describe all possible interactions. Give your health care provider a list of all the medicines, herbs, non-prescription drugs, or dietary supplements you use. Also tell them if you  smoke, drink alcohol, or use illegal drugs. Some items may interact with your medicine. What should I watch for while using this medicine? Your condition will be monitored carefully while you are receiving this medicine. If you are taking this medicine for a long time, carry an identification card with your name and address, the type and dose of your medicine, and your doctor's name and address. This medicine may increase your risk of getting an infection. Stay away from people who are sick. Tell your doctor or health care professional if you are around anyone with measles or chickenpox. Talk to your health care provider before you get any vaccines that you take this medicine. If you are going to have surgery, tell your doctor or health care professional that you have taken this medicine within the last twelve months. Ask your doctor or health care professional about your diet. You may need to lower the amount of salt you eat. The medicine can increase your blood sugar. If you are a diabetic check with your doctor if you need help adjusting the dose of your diabetic medicine. What side effects may I notice from receiving this medicine? Side effects that you should report to your doctor or health care professional as soon as possible: -allergic reactions like skin rash, itching or hives, swelling of the face, lips,  or tongue -black or tarry stools -change in the amount of urine -changes in vision -confusion, excitement, restlessness, a false sense of well-being -fever, sore throat, sneezing, cough, or other signs of infection, wounds that will not heal -hallucinations -increased thirst -mental depression, mood swings, mistaken feelings of self importance or of being mistreated -pain in hips, back, ribs, arms, shoulders, or legs -pain, redness, or irritation at the injection site -redness, blistering, peeling or loosening of the skin, including inside the mouth -rounding out of face -swelling of feet or lower legs -unusual bleeding or bruising -unusual tired or weak -wounds that do not heal Side effects that usually do not require medical attention (report to your doctor or health care professional if they continue or are bothersome): -diarrhea or constipation -change in taste -headache -nausea, vomiting -skin problems, acne, thin and shiny skin -touble sleeping -unusual growth of hair on the face or body -weight gain This list may not describe all possible side effects. Call your doctor for medical advice about side effects. You may report side effects to FDA at 1-800-FDA-1088. Where should I keep my medicine? This drug is given in a hospital or clinic and will not be stored at home. NOTE: This sheet is a summary. It may not cover all possible information. If you have questions about this medicine, talk to your doctor, pharmacist, or health care provider.  2015, Elsevier/Gold Standard. (2007-07-28 14:04:12) Metoclopramide tablets What is this medicine? METOCLOPRAMIDE (met oh kloe PRA mide) is used to treat the symptoms of gastroesophageal reflux disease (GERD) like heartburn. It is also used to treat people with slow emptying of the stomach and intestinal tract. This medicine may be used for other purposes; ask your health care provider or pharmacist if you have questions. COMMON BRAND  NAME(S): Reglan What should I tell my health care provider before I take this medicine? They need to know if you have any of these conditions: -breast cancer -depression -diabetes -heart failure -high blood pressure -kidney disease -liver disease -Parkinson's disease or a movement disorder -pheochromocytoma -seizures -stomach obstruction, bleeding, or perforation -an unusual or allergic reaction to metoclopramide, procainamide, sulfites, other medicines, foods, dyes,  or preservatives -pregnant or trying to get pregnant -breast-feeding How should I use this medicine? Take this medicine by mouth with a glass of water. Follow the directions on the prescription label. Take this medicine on an empty stomach, about 30 minutes before eating. Take your doses at regular intervals. Do not take your medicine more often than directed. Do not stop taking except on the advice of your doctor or health care professional. A special MedGuide will be given to you by the pharmacist with each prescription and refill. Be sure to read this information carefully each time. Talk to your pediatrician regarding the use of this medicine in children. Special care may be needed. Overdosage: If you think you have taken too much of this medicine contact a poison control center or emergency room at once. NOTE: This medicine is only for you. Do not share this medicine with others. What if I miss a dose? If you miss a dose, take it as soon as you can. If it is almost time for your next dose, take only that dose. Do not take double or extra doses. What may interact with this medicine? -acetaminophen -cyclosporine -digoxin -medicines for blood pressure -medicines for diabetes, including insulin -medicines for hay fever and other allergies -medicines for depression, especially an Monoamine Oxidase Inhibitor (MAOI) -medicines for Parkinson's disease, like levodopa -medicines for sleep or for pain -tetracycline This  list may not describe all possible interactions. Give your health care provider a list of all the medicines, herbs, non-prescription drugs, or dietary supplements you use. Also tell them if you smoke, drink alcohol, or use illegal drugs. Some items may interact with your medicine. What should I watch for while using this medicine? It may take a few weeks for your stomach condition to start to get better. However, do not take this medicine for longer than 12 weeks. The longer you take this medicine, and the more you take it, the greater your chances are of developing serious side effects. If you are an elderly patient, a male patient, or you have diabetes, you may be at an increased risk for side effects from this medicine. Contact your doctor immediately if you start having movements you cannot control such as lip smacking, rapid movements of the tongue, involuntary or uncontrollable movements of the eyes, head, arms and legs, or muscle twitches and spasms. Patients and their families should watch out for worsening depression or thoughts of suicide. Also watch out for any sudden or severe changes in feelings such as feeling anxious, agitated, panicky, irritable, hostile, aggressive, impulsive, severely restless, overly excited and hyperactive, or not being able to sleep. If this happens, especially at the beginning of treatment or after a change in dose, call your doctor. Do not treat yourself for high fever. Ask your doctor or health care professional for advice. You may get drowsy or dizzy. Do not drive, use machinery, or do anything that needs mental alertness until you know how this drug affects you. Do not stand or sit up quickly, especially if you are an older patient. This reduces the risk of dizzy or fainting spells. Alcohol can make you more drowsy and dizzy. Avoid alcoholic drinks. What side effects may I notice from receiving this medicine? Side effects that you should report to your doctor or  health care professional as soon as possible: -allergic reactions like skin rash, itching or hives, swelling of the face, lips, or tongue -abnormal production of milk in females -breast enlargement in both males and  females -change in the way you walk -difficulty moving, speaking or swallowing -drooling, lip smacking, or rapid movements of the tongue -excessive sweating -fever -involuntary or uncontrollable movements of the eyes, head, arms and legs -irregular heartbeat or palpitations -muscle twitches and spasms -unusually weak or tired Side effects that usually do not require medical attention (report to your doctor or health care professional if they continue or are bothersome): -change in sex drive or performance -depressed mood -diarrhea -difficulty sleeping -headache -menstrual changes -restless or nervous This list may not describe all possible side effects. Call your doctor for medical advice about side effects. You may report side effects to FDA at 1-800-FDA-1088. Where should I keep my medicine? Keep out of the reach of children. Store at room temperature between 20 and 25 degrees C (68 and 77 degrees F). Protect from light. Keep container tightly closed. Throw away any unused medicine after the expiration date. NOTE: This sheet is a summary. It may not cover all possible information. If you have questions about this medicine, talk to your doctor, pharmacist, or health care provider.  2015, Elsevier/Gold Standard. (2011-08-04 13:04:38) Prochlorperazine tablets What is this medicine? PROCHLORPERAZINE (proe klor PER a zeen) helps to control severe nausea and vomiting. This medicine is also used to treat schizophrenia. It can also help patients who experience anxiety that is not due to psychological illness. This medicine may be used for other purposes; ask your health care provider or pharmacist if you have questions. COMMON BRAND NAME(S): Compazine What should I tell my  health care provider before I take this medicine? They need to know if you have any of these conditions: -blood disorders or disease -dementia -liver disease or jaundice -Parkinson's disease -uncontrollable movement disorder -an unusual or allergic reaction to prochlorperazine, other medicines, foods, dyes, or preservatives -pregnant or trying to get pregnant -breast-feeding How should I use this medicine? Take this medicine by mouth with a glass of water. Follow the directions on the prescription label. Take your doses at regular intervals. Do not take your medicine more often than directed. Do not stop taking this medicine suddenly. This can cause nausea, vomiting, and dizziness. Ask your doctor or health care professional for advice. Talk to your pediatrician regarding the use of this medicine in children. Special care may be needed. While this drug may be prescribed for children as young as 2 years for selected conditions, precautions do apply. Overdosage: If you think you have taken too much of this medicine contact a poison control center or emergency room at once. NOTE: This medicine is only for you. Do not share this medicine with others. What if I miss a dose? If you miss a dose, take it as soon as you can. If it is almost time for your next dose, take only that dose. Do not take double or extra doses. What may interact with this medicine? Do not take this medicine with any of the following medications: -amoxapine -antidepressants like citalopram, escitalopram, fluoxetine, paroxetine, and sertraline -deferoxamine -dofetilide -maprotiline -tricyclic antidepressants like amitriptyline, clomipramine, imipramine, nortiptyline and others This medicine may also interact with the following medications: -lithium -medicines for pain -phenytoin -propranolol -warfarin This list may not describe all possible interactions. Give your health care provider a list of all the medicines, herbs,  non-prescription drugs, or dietary supplements you use. Also tell them if you smoke, drink alcohol, or use illegal drugs. Some items may interact with your medicine. What should I watch for while using this medicine? Visit your  doctor or health care professional for regular checks on your progress. You may get drowsy or dizzy. Do not drive, use machinery, or do anything that needs mental alertness until you know how this medicine affects you. Do not stand or sit up quickly, especially if you are an older patient. This reduces the risk of dizzy or fainting spells. Alcohol may interfere with the effect of this medicine. Avoid alcoholic drinks. This medicine can reduce the response of your body to heat or cold. Dress warm in cold weather and stay hydrated in hot weather. If possible, avoid extreme temperatures like saunas, hot tubs, very hot or cold showers, or activities that can cause dehydration such as vigorous exercise. This medicine can make you more sensitive to the sun. Keep out of the sun. If you cannot avoid being in the sun, wear protective clothing and use sunscreen. Do not use sun lamps or tanning beds/booths. Your mouth may get dry. Chewing sugarless gum or sucking hard candy, and drinking plenty of water may help. Contact your doctor if the problem does not go away or is severe. What side effects may I notice from receiving this medicine? Side effects that you should report to your doctor or health care professional as soon as possible: -blurred vision -breast enlargement in men or women -breast milk in women who are not breast-feeding -chest pain, fast or irregular heartbeat -confusion, restlessness -dark yellow or brown urine -difficulty breathing or swallowing -dizziness or fainting spells -drooling, shaking, movement difficulty (shuffling walk) or rigidity -fever, chills, sore throat -involuntary or uncontrollable movements of the eyes, mouth, head, arms, and  legs -seizures -stomach area pain -unusually weak or tired -unusual bleeding or bruising -yellowing of skin or eyes Side effects that usually do not require medical attention (report to your doctor or health care professional if they continue or are bothersome): -difficulty passing urine -difficulty sleeping -headache -sexual dysfunction -skin rash, or itching This list may not describe all possible side effects. Call your doctor for medical advice about side effects. You may report side effects to FDA at 1-800-FDA-1088. Where should I keep my medicine? Keep out of the reach of children. Store at room temperature between 15 and 30 degrees C (59 and 86 degrees F). Protect from light. Throw away any unused medicine after the expiration date. NOTE: This sheet is a summary. It may not cover all possible information. If you have questions about this medicine, talk to your doctor, pharmacist, or health care provider.  2015, [employer]vier/Gold Standard. (2011-08-25 16:59:39) Implanted Port Insertion An implanted port is a central line that has a round shape and is placed under the skin. It is used as a long-term IV access for:   Medicines, such as chemotherapy.   Fluids.   Liquid nutrition, such as total parenteral nutrition (TPN).   Blood samples.  LET [facility] CARE PROVIDER KNOW ABOUT:  Allergies to food or medicine.   Medicines taken, including vitamins, herbs, eye drops, creams, and over-the-counter medicines.   Any allergies to heparin.  Use of steroids (by mouth or creams).   Previous problems with anesthetics or numbing medicines.   History of bleeding problems or blood clots.   Previous surgery.   Other health problems, including diabetes and kidney problems.   Possibility of pregnancy, if this applies. RISKS AND COMPLICATIONS Generally, this is a safe procedure. However, as with any procedure, problems can occur. Possible problems include:  Damage to the  blood vessel, bruising, or bleeding at the puncture site.  Infection.  Blood clot in the vessel that the port is in.  Breakdown of the skin over your port.  Very rarely a person may develop a condition called a pneumothorax, a collection of air in the chest that may cause one of the lungs to collapse. The placement of these catheters with the appropriate imaging guidance significantly decreases the risk of a pneumothorax.  BEFORE THE PROCEDURE   Your health care provider may want you to have blood tests. These tests can help tell how well your kidneys and liver are working. They can also show how well your blood clots.   If you take blood thinners (anticoagulant medicines), ask your health care provider when you should stop taking them.   Make arrangements for someone to drive you home. This is necessary if you have been sedated for your procedure.  PROCEDURE  Port insertion usually takes about 30-45 minutes.   An IV needle will be inserted in your arm. Medicine for pain and medicine to help relax you (sedative) will flow directly into your body through this needle.   You will lie on an exam table, and you will be connected to monitors to keep track of your heart rate, blood pressure, and breathing throughout the procedure.  An oxygen monitoring device may be attached to your finger. Oxygen will be given.   Everything will be kept as germ free (sterile) as possible during the procedure. The skin near the point of the incision will be cleansed with antiseptic, and the area will be draped with sterile towels. The skin and deeper tissues over the port area will be made numb with a local anesthetic.  Two small cuts (incisions) will be made in the skin to insert the port. One will be made in the neck to obtain access to the vein where the catheter will lie.   Because the port reservoir will be placed under the skin, a small skin incision will be made in the upper chest, and a small  pocket for the port will be made under the skin. The catheter that will be connected to the port tunnels to a large central vein in the chest. A small, raised area will remain on your body at the site of the reservoir when the procedure is complete.  The port placement will be done under imaging guidance to ensure the proper placement.  The reservoir has a silicone covering that can be punctured with a special needle.   The port will be flushed with normal saline, and blood will be drawn to make sure it is working properly.  There will be nothing remaining outside the skin when the procedure is finished.   Incisions will be held together by stitches, surgical glue, or a special tape. AFTER THE PROCEDURE  You will stay in a recovery area until the anesthesia has worn off. Your blood pressure and pulse will be checked.  A final chest X-ray will be taken to check the placement of the port and to ensure that there is no injury to your lung. Document Released: 01/25/2013 Document Revised: 08/21/2013 Document Reviewed: 01/25/2013 [facility] Patient Information 2015 [place], [place]. This information is not intended to replace advice given to you by your health care provider. Make sure you discuss any questions you have with your health care provider. Lidocaine; Prilocaine cream What is this medicine? LIDOCAINE; PRILOCAINE (LYE doe kane; PRIL oh kane) is a topical anesthetic that causes loss of feeling in the skin and surrounding tissues. It is  used to numb the skin before procedures or injections. This medicine may be used for other purposes; ask your health care provider or pharmacist if you have questions. COMMON BRAND NAME(S): EMLA What should I tell my health care provider before I take this medicine? They need to know if you have any of these conditions: -glucose-6-phosphate deficiencies -heart disease -kidney or liver disease -methemoglobinemia -an unusual or allergic reaction to  lidocaine, prilocaine, other medicines, foods, dyes, or preservatives -pregnant or trying to get pregnant -breast-feeding How should I use this medicine? This medicine is for external use only on the skin. Do not take by mouth. Follow the directions on the prescription label. Wash hands before and after use. Do not use more or leave in contact with the skin longer than directed. Do not apply to eyes or open wounds. It can cause irritation and blurred or temporary loss of vision. If this medicine comes in contact with your eyes, immediately rinse the eye with water. Do not touch or rub the eye. Contact your health care provider right away. Talk to your pediatrician regarding the use of this medicine in children. While this medicine may be prescribed for children for selected conditions, precautions do apply. Overdosage: If you think you have taken too much of this medicine contact a poison control center or emergency room at once. NOTE: This medicine is only for you. Do not share this medicine with others. What if I miss a dose? This medicine is usually only applied once prior to each procedure. It must be in contact with the skin for a period of time for it to work. If you applied this medicine later than directed, tell your health care professional before starting the procedure. What may interact with this medicine? -acetaminophen -chloroquine -dapsone -medicines to control heart rhythm -nitrates like nitroglycerin and nitroprusside -other ointments, creams, or sprays that may contain anesthetic medicine -phenobarbital -phenytoin -quinine -sulfonamides like sulfacetamide, sulfamethoxazole, sulfasalazine and others This list may not describe all possible interactions. Give your health care provider a list of all the medicines, herbs, non-prescription drugs, or dietary supplements you use. Also tell them if you smoke, drink alcohol, or use illegal drugs. Some items may interact with your  medicine. What should I watch for while using this medicine? Be careful to avoid injury to the treated area while it is numb and you are not aware of pain. Avoid scratching, rubbing, or exposing the treated area to hot or cold temperatures until complete sensation has returned. The numb feeling will wear off a few hours after applying the cream. What side effects may I notice from receiving this medicine? Side effects that you should report to your doctor or health care professional as soon as possible: -blurred vision -chest pain -difficulty breathing -dizziness -drowsiness -fast or irregular heartbeat -skin rash or itching -swelling of your throat, lips, or face -trembling Side effects that usually do not require medical attention (report to your doctor or health care professional if they continue or are bothersome): -changes in ability to feel hot or cold -redness and swelling at the application site This list may not describe all possible side effects. Call your doctor for medical advice about side effects. You may report side effects to FDA at 1-800-FDA-1088. Where should I keep my medicine? Keep out of reach of children. Store at room temperature between 15 and 30 degrees C (59 and 86 degrees F). Keep container tightly closed. Throw away any unused medicine after the expiration date. NOTE: This  sheet is a summary. It may not cover all possible information. If you have questions about this medicine, talk to your doctor, pharmacist, or health care provider.  2015, Elsevier/Gold Standard. (2007-10-10 17:14:35)

## 2013-11-20 ENCOUNTER — Other Ambulatory Visit (HOSPITAL_COMMUNITY): Payer: Self-pay | Admitting: Hematology and Oncology

## 2013-11-21 ENCOUNTER — Encounter (HOSPITAL_COMMUNITY): Payer: 59 | Attending: Hematology and Oncology

## 2013-11-21 DIAGNOSIS — C3410 Malignant neoplasm of upper lobe, unspecified bronchus or lung: Secondary | ICD-10-CM | POA: Insufficient documentation

## 2013-11-21 DIAGNOSIS — C221 Intrahepatic bile duct carcinoma: Secondary | ICD-10-CM | POA: Insufficient documentation

## 2013-11-21 DIAGNOSIS — C3411 Malignant neoplasm of upper lobe, right bronchus or lung: Secondary | ICD-10-CM

## 2013-11-21 MED ORDER — ESOMEPRAZOLE MAGNESIUM 40 MG PO CPDR: 40.0000 mg | DELAYED_RELEASE_CAPSULE | Freq: Two times a day (BID) | ORAL | Status: DC

## 2013-11-21 NOTE — Progress Notes (Signed)
Dr. Barnet Glasgow said that it was ok to Tylenol (acetaminophen) if you need it for pain. Since you have Oxycodone 5/325mg  tablet- you can cut it in half and only take half the dose. We changed your Prilosec to Nexium. So stop taking Prilosec and start taking Nexium prior to meals. Make sure you buy a thermometer and check your temperature one a day and as needed. Also, buy some Imodium and follow the instructions that I provided on the chemo instruction sheets rather than what is on the box - but also call us when/if you first start experiencing diarrhea.   Chemo teaching done and consent signed for Cisplatin, Gemzar, & Afatinib. Distress screening done also. Med/chemo calendar given to patient.

## 2013-11-23 ENCOUNTER — Encounter (HOSPITAL_BASED_OUTPATIENT_CLINIC_OR_DEPARTMENT_OTHER): Payer: 59

## 2013-11-23 VITALS — BP 138/75 | HR 66 | Temp 97.5°F | Resp 20 | Wt 212.0 lb

## 2013-11-23 DIAGNOSIS — C249 Malignant neoplasm of biliary tract, unspecified: Secondary | ICD-10-CM

## 2013-11-23 DIAGNOSIS — Z5111 Encounter for antineoplastic chemotherapy: Secondary | ICD-10-CM

## 2013-11-23 DIAGNOSIS — C3411 Malignant neoplasm of upper lobe, right bronchus or lung: Secondary | ICD-10-CM

## 2013-11-23 DIAGNOSIS — C3410 Malignant neoplasm of upper lobe, unspecified bronchus or lung: Secondary | ICD-10-CM

## 2013-11-23 MED ORDER — PALONOSETRON HCL INJECTION 0.25 MG/5ML
0.2500 mg | Freq: Once | INTRAVENOUS | Status: AC
  Administered 2013-11-23: 0.25 mg via INTRAVENOUS

## 2013-11-23 MED ORDER — SODIUM CHLORIDE 0.9 % IV SOLN: 150.0000 mg | Freq: Once | INTRAVENOUS | Status: DC

## 2013-11-23 MED ORDER — POTASSIUM CHLORIDE 2 MEQ/ML IV SOLN
Freq: Once | INTRAVENOUS | Status: AC
  Administered 2013-11-23: 10:00:00 via INTRAVENOUS
  Filled 2013-11-23: qty 10

## 2013-11-23 MED ORDER — PALONOSETRON HCL INJECTION 0.25 MG/5ML
INTRAVENOUS | Status: AC
  Filled 2013-11-23: qty 5

## 2013-11-23 MED ORDER — DEXAMETHASONE SODIUM PHOSPHATE 20 MG/5ML IJ SOLN: 12.0000 mg | Freq: Once | INTRAMUSCULAR | Status: DC

## 2013-11-23 MED ORDER — SODIUM CHLORIDE 0.9 % IV SOLN
1000.0000 mg/m2 | Freq: Once | INTRAVENOUS | Status: AC
  Administered 2013-11-23: 2166 mg via INTRAVENOUS
  Filled 2013-11-23: qty 57

## 2013-11-23 MED ORDER — SODIUM CHLORIDE 0.9 % IV SOLN
25.0000 mg/m2 | Freq: Once | INTRAVENOUS | Status: AC
  Administered 2013-11-23: 54 mg via INTRAVENOUS
  Filled 2013-11-23: qty 54

## 2013-11-23 MED ORDER — FOSAPREPITANT DIMEGLUMINE INJECTION 150 MG
Freq: Once | INTRAVENOUS | Status: AC
  Administered 2013-11-23: 11:00:00 via INTRAVENOUS
  Filled 2013-11-23: qty 5

## 2013-11-23 NOTE — Patient Instructions (Signed)
[facility] Discharge Instructions for Patients Receiving Chemotherapy  Today you received the following chemotherapy agents: Cisplatin & Gemzar  To help prevent nausea and vomiting after your treatment, we encourage you to take your nausea medication: Prochlorperazine/Compazine 10mg  tablet. Starting the day after chemo, take 1 tablet four times a day x 48 hours. Then only take it (up to 4 times a day if needed). If you feel nauseated, take it!!!!!  Continue taking your Metoclopramide/Reglan as prescribed by Dr. Barnet Glasgow.   When taking Metoclopramide & Prochlorperazine together - you may get sleepy. If you are too sleepy, cut back on the Prochlorperazine. Instead of taking it four times a day on Friday/Saturday - you may want to cut back to 2 times a day.   Please call us with any questions/concerns.  If you develop nausea and vomiting, or diarrhea that is not controlled by your medication, call the clinic.  The clinic phone number is [phone]. Office hours are Monday-Friday 8:30am-5:00pm.  BELOW ARE SYMPTOMS THAT SHOULD BE REPORTED IMMEDIATELY:  *FEVER GREATER THAN 101.0 F  *CHILLS WITH OR WITHOUT FEVER  NAUSEA AND VOMITING THAT IS NOT CONTROLLED WITH YOUR NAUSEA MEDICATION  *UNUSUAL SHORTNESS OF BREATH  *UNUSUAL BRUISING OR BLEEDING  TENDERNESS IN MOUTH AND THROAT WITH OR WITHOUT PRESENCE OF ULCERS  *URINARY PROBLEMS  *BOWEL PROBLEMS  UNUSUAL RASH Items with * indicate a potential emergency and should be followed up as soon as possible. If you have an emergency after office hours please contact your primary care physician or go to the nearest emergency department.  Please call the clinic during office hours if you have any questions or concerns.   You may also contact the Patient Navigator at [phone] should you have any questions or need assistance in obtaining follow up  care. _____________________________________________________________________ Have you asked about our STAR program?    STAR stands for Survivorship Training and Rehabilitation, and this is a nationally recognized cancer care program that focuses on survivorship and rehabilitation.  Cancer and cancer treatments may cause problems, such as, pain, making you feel tired and keeping you from doing the things that you need or want to do. Cancer rehabilitation can help. Our goal is to reduce these troubling effects and help you have the best quality of life possible.  You may receive a survey from a nurse that asks questions about your current state of health.  Based on the survey results, all eligible patients will be referred to the [facility] program for an evaluation so we can better serve you! A frequently asked questions sheet is available upon request.

## 2013-11-23 NOTE — Progress Notes (Signed)
Tolerated well

## 2013-11-24 ENCOUNTER — Telehealth (HOSPITAL_COMMUNITY): Payer: Self-pay | Admitting: *Deleted

## 2013-11-24 NOTE — Telephone Encounter (Signed)
Patient doing ok today. Patient woke up last pm with night sweats. Sleeping at present time his wife stated. She says that he told his daughter that he feels better today than he did yesterday. No pain present today. He stated to his wife that he did have a tiny bit of tingling in his feet. I will contact patient on Monday to see how he is doing.

## 2013-11-27 ENCOUNTER — Telehealth (HOSPITAL_COMMUNITY): Payer: Self-pay | Admitting: *Deleted

## 2013-11-27 NOTE — Telephone Encounter (Signed)
Patient states that he may be just a little more tired than usual. Patient has not had nausea. Patient called with a question regarding the Afatinib and whether or not to start it Tuesday night. Medication is supposed to come tomorrow. I told patient to go ahead and start it tomorrow night on an empty stomach. Patient verbalized understanding.

## 2013-11-30 ENCOUNTER — Encounter (HOSPITAL_COMMUNITY): Payer: Self-pay

## 2013-11-30 ENCOUNTER — Encounter (HOSPITAL_BASED_OUTPATIENT_CLINIC_OR_DEPARTMENT_OTHER): Payer: 59

## 2013-11-30 VITALS — BP 130/73 | HR 66 | Temp 97.7°F | Resp 20

## 2013-11-30 DIAGNOSIS — C3410 Malignant neoplasm of upper lobe, unspecified bronchus or lung: Secondary | ICD-10-CM

## 2013-11-30 DIAGNOSIS — D702 Other drug-induced agranulocytosis: Secondary | ICD-10-CM

## 2013-11-30 DIAGNOSIS — T50905A Adverse effect of unspecified drugs, medicaments and biological substances, initial encounter: Secondary | ICD-10-CM

## 2013-11-30 DIAGNOSIS — C3411 Malignant neoplasm of upper lobe, right bronchus or lung: Secondary | ICD-10-CM

## 2013-11-30 DIAGNOSIS — I10 Essential (primary) hypertension: Secondary | ICD-10-CM

## 2013-11-30 DIAGNOSIS — D6959 Other secondary thrombocytopenia: Secondary | ICD-10-CM

## 2013-11-30 DIAGNOSIS — C221 Intrahepatic bile duct carcinoma: Secondary | ICD-10-CM | POA: Diagnosis present

## 2013-11-30 DIAGNOSIS — Z5111 Encounter for antineoplastic chemotherapy: Secondary | ICD-10-CM

## 2013-11-30 DIAGNOSIS — T50904A Poisoning by unspecified drugs, medicaments and biological substances, undetermined, initial encounter: Secondary | ICD-10-CM

## 2013-11-30 LAB — COMPREHENSIVE METABOLIC PANEL
ALK PHOS: 381 U/L — AB (ref 39–117)
ALT: 123 U/L — AB (ref 0–53)
AST: 109 U/L — AB (ref 0–37)
Albumin: 2.6 g/dL — ABNORMAL LOW (ref 3.5–5.2)
Anion gap: 11 (ref 5–15)
BILIRUBIN TOTAL: 2.7 mg/dL — AB (ref 0.3–1.2)
BUN: 18 mg/dL (ref 6–23)
CHLORIDE: 99 meq/L (ref 96–112)
CO2: 23 meq/L (ref 19–32)
Calcium: 9.1 mg/dL (ref 8.4–10.5)
Creatinine, Ser: 0.72 mg/dL (ref 0.50–1.35)
GFR calc Af Amer: >90 mL/min (ref >90)
Glucose, Bld: 247 mg/dL — ABNORMAL HIGH (ref 70–99)
POTASSIUM: 3.9 meq/L (ref 3.7–5.3)
Sodium: 133 mEq/L — ABNORMAL LOW (ref 137–147)
Total Protein: 6.4 g/dL (ref 6.0–8.3)

## 2013-11-30 LAB — CBC WITH DIFFERENTIAL/PLATELET
BASOS ABS: 0 10*3/uL (ref 0.0–0.1)
Basophils Relative: 2 % — ABNORMAL HIGH (ref 0–1)
Eosinophils Absolute: 0 10*3/uL (ref 0.0–0.7)
Eosinophils Relative: 2 % (ref 0–5)
HEMATOCRIT: 35.6 % — AB (ref 39.0–52.0)
Hemoglobin: 12.3 g/dL — ABNORMAL LOW (ref 13.0–17.0)
LYMPHS PCT: 56 % — AB (ref 12–46)
Lymphs Abs: 0.4 10*3/uL — ABNORMAL LOW (ref 0.7–4.0)
MCH: 31.9 pg (ref 26.0–34.0)
MCHC: 34.6 g/dL (ref 30.0–36.0)
MCV: 92.2 fL (ref 78.0–100.0)
MONO ABS: 0 10*3/uL — AB (ref 0.1–1.0)
Monocytes Relative: 2 % — ABNORMAL LOW (ref 3–12)
NEUTROS ABS: 0.3 10*3/uL — AB (ref 1.7–7.7)
Neutrophils Relative %: 40 % — ABNORMAL LOW (ref 43–77)
Platelets: 47 10*3/uL — ABNORMAL LOW (ref 150–400)
RBC: 3.86 MIL/uL — ABNORMAL LOW (ref 4.22–5.81)
RDW: 13.6 % (ref 11.5–15.5)
WBC: 0.6 10*3/uL — AB (ref 4.0–10.5)

## 2013-11-30 MED ORDER — PALONOSETRON HCL INJECTION 0.25 MG/5ML
0.2500 mg | Freq: Once | INTRAVENOUS | Status: AC
  Administered 2013-11-30: 0.25 mg via INTRAVENOUS
  Filled 2013-11-30: qty 5

## 2013-11-30 MED ORDER — POTASSIUM CHLORIDE 2 MEQ/ML IV SOLN
Freq: Once | INTRAVENOUS | Status: AC
  Administered 2013-11-30: 09:00:00 via INTRAVENOUS
  Filled 2013-11-30: qty 10

## 2013-11-30 MED ORDER — SODIUM CHLORIDE 0.9 % IV SOLN
25.0000 mg/m2 | Freq: Once | INTRAVENOUS | Status: AC
  Administered 2013-11-30: 54 mg via INTRAVENOUS
  Filled 2013-11-30: qty 54

## 2013-11-30 MED ORDER — HEPARIN SOD (PORK) LOCK FLUSH 100 UNIT/ML IV SOLN
500.0000 [IU] | Freq: Once | INTRAVENOUS | Status: DC | PRN
  Filled 2013-11-30: qty 5

## 2013-11-30 MED ORDER — SODIUM CHLORIDE 0.9 % IJ SOLN: 10.0000 mL | INTRAMUSCULAR | Status: DC | PRN

## 2013-11-30 MED ORDER — SODIUM CHLORIDE 0.9 % IV SOLN
Freq: Once | INTRAVENOUS | Status: AC
  Administered 2013-11-30: 11:00:00 via INTRAVENOUS
  Filled 2013-11-30: qty 5

## 2013-11-30 MED ORDER — CIPROFLOXACIN HCL 500 MG PO TABS: ORAL_TABLET | ORAL | Status: DC

## 2013-11-30 NOTE — Progress Notes (Signed)
[facility]  OFFICE PROGRESS NOTE  [name], MD [address] [place] [address]  DIAGNOSIS: Intrahepatic cholangiocarcinoma - Plan: CBC with Differential, Comprehensive metabolic panel  Malignant neoplasm of upper lobe of right lung  Unspecified essential hypertension  Drug induced neutropenia(288.03)  Thrombocytopenia due to drugs  Chief Complaint  Patient presents with  . Cholangiocarcinoma  . Lung Cancer    EGFR mutated adenocarcinoma    CURRENT THERAPY: Cis-platinum/gemcitabine day 1 and 8 every 28 days.  INTERVAL HISTORY: Bryce Daugherty 68 y.o. male returns for :followup of cholangiocarcinoma with periportal lymphadenopathy in addition to separate adenocarcinoma lung , the latter treated with SBRT x3 on 10/02/2013, 10/04/2013, and 10/05/2013., status post biliary tract stenting with replacement of stent most recently on 10/12/2013 after recurrence of jaundice. Plan is to receive day 8 of cycle 1 of therapy today. Nausea was controlled well. He denies any worsening abdominal distention. He denies any lower extremity swelling or redness but is fatigued. He denies any epistaxis, fever, night sweats, abdominal pain, sore throat, diarrhea, constipation, melena, hematochezia, hematuria, dark urine, or lighter stools. He started Afatinib 2 days ago.  MEDICAL HISTORY: Past Medical History  Diagnosis Date  . Hypertension   . Chronic kidney disease   . Arthritis     lower back, hips knees  . Kidney stones     bilateral stones pain since 04/11/11  . Diabetes mellitus     treatment for 2 years  . 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 Adenocarcinoma, lung     s/p radiation treatments    INTERIM HISTORY: has Ureteral stone; Dilation of biliary tract; Jaundice; Loss of weight; Obstructive jaundice; Type II or unspecified type diabetes mellitus without mention of complication, not stated as uncontrolled; Unspecified  essential hypertension; Lung cancer, upper lobe; Intrahepatic cholangiocarcinoma; and Ascites on his problem list.    ALLERGIES:  has No Known Allergies.  MEDICATIONS: has a current medication list which includes the following prescription(s): acetaminophen, afatinib dimaleate, aspirin ec, atenolol, ciprofloxacin, doxazosin, glipizide, metformin, metoclopramide, omeprazole, and prochlorperazine, and the following Facility-Administered Medications: CISplatin (PLATINOL) 54 mg in sodium chloride 0.9 % 250 mL chemo infusion, fosaprepitant (EMEND) 150 mg, dexamethasone (DECADRON) 12 mg in sodium chloride 0.9 % 145 mL IVPB, heparin lock flush, and sodium chloride.  SURGICAL HISTORY:  Past Surgical History  Procedure Laterality Date  . Cystectomy  under left arm  . Colonoscopy  03/24/2011    FSE:[id number] hemorrhoids  . Cholecystectomy      1984  . Catarac both eyes    . Tonsillectomy    . Ercp N/A 08/03/2013    Dr. Gala Romney: with sphincterotomy, balloon dilation, stone extraction, plastic stent placement. Tbili 26 pre-operatively  . Sphincterotomy N/A 08/03/2013    Procedure: SPHINCTEROTOMY;  Surgeon: Daneil Dolin, MD;  Location: [facility];  Service: Endoscopy;  Laterality: N/A;  with dilation   . Biliary stent placement N/A 08/03/2013    Procedure: BILIARY STENT PLACEMENT;  Surgeon: Daneil Dolin, MD;  Location: [facility];  Service: Endoscopy;  Laterality: N/A;  . Removal of stones N/A 08/03/2013    Procedure: REMOVAL OF STONES;  Surgeon: Daneil Dolin, MD;  Location: [facility];  Service: Endoscopy;  Laterality: N/A;  . Ercp N/A 10/12/2013    Procedure: ENDOSCOPIC RETROGRADE CHOLANGIOPANCREATOGRAPHY (ERCP);  Surgeon: Daneil Dolin, MD;  Location: [facility];  Service: Endoscopy;  Laterality: N/A;  . Biliary stent placement N/A 10/12/2013  Procedure: BILIARY STENT REPLACEMENT;  Surgeon: Daneil Dolin, MD;  Location: [facility];  Service: Endoscopy;  Laterality: N/A;  . Lung biopsy  08/31/13    FAMILY HISTORY:  family history includes Heart attack in his father; Pancreatic cancer in his mother. There is no history of Colon cancer.  SOCIAL HISTORY:  reports that he quit smoking about 25 years ago. His smoking use included Cigarettes. He has a 20 pack-year smoking history. He has never used smokeless tobacco. He reports that he does not drink alcohol or use illicit drugs.  REVIEW OF SYSTEMS:  Other than that discussed above is noncontributory.  PHYSICAL EXAMINATION: ECOG PERFORMANCE STATUS: 1 - Symptomatic but completely ambulatory  There were no vitals taken for this visit.  GENERAL:alert, no distress and comfortable SKIN: skin color, texture, turgor are normal, no rashes or significant lesions EYES: PERLA; Conjunctiva are pink and non-injected, sclera clear SINUSES: No redness or tenderness over maxillary or ethmoid sinuses OROPHARYNX:no exudate, no erythema on lips, buccal mucosa, or tongue. NECK: supple, thyroid normal size, non-tender, without nodularity. No masses CHEST: Normal AP diameter with no breast masses.  LYMPH:  no palpable lymphadenopathy in the cervical, axillary or inguinal LUNGS: clear to auscultation and percussion with normal breathing effort HEART: regular rate & rhythm and no murmurs. ABDOMEN:abdomen soft, non-tender and normal bowel sounds. Minimal ascites.  MUSCULOSKELETAL:no cyanosis of digits and no clubbing. Range of motion normal.  NEURO: alert & oriented x 3 with fluent speech, no focal motor/sensory deficits   LABORATORY DATA: Infusion on 11/30/2013  Component Date Value Ref Range Status  . WBC 11/30/2013 0.6* 4.0 - 10.5 K/uL Final   Comment: RESULT REPEATED AND VERIFIED                          WHITE COUNT CONFIRMED ON SMEAR                          CRITICAL RESULT CALLED TO, READ BACK BY AND VERIFIED WITH:                          Caren Macadam ON [id number] AT 0935 BY RESSEGGER R  . RBC 11/30/2013 3.86* 4.22 - 5.81 MIL/uL Final  . Hemoglobin 11/30/2013 12.3*  13.0 - 17.0 g/dL Final  . HCT 11/30/2013 35.6* 39.0 - 52.0 % Final  . MCV 11/30/2013 92.2  78.0 - 100.0 fL Final  . MCH 11/30/2013 31.9  26.0 - 34.0 pg Final  . MCHC 11/30/2013 34.6  30.0 - 36.0 g/dL Final  . RDW 11/30/2013 13.6  11.5 - 15.5 % Final  . Platelets 11/30/2013 47* 150 - 400 K/uL Final   Comment: RESULT REPEATED AND VERIFIED                          PLATELET COUNT CONFIRMED BY SMEAR                          SPECIMEN CHECKED FOR CLOTS  . Neutrophils Relative % 11/30/2013 40* 43 - 77 % Final  . Neutro Abs 11/30/2013 0.3* 1.7 - 7.7 K/uL Final  . Lymphocytes Relative 11/30/2013 56* 12 - 46 % Final  . Lymphs Abs 11/30/2013 0.4* 0.7 - 4.0 K/uL Final  . Monocytes Relative 11/30/2013 2* 3 - 12 % Final  . Monocytes Absolute 11/30/2013  0.0* 0.1 - 1.0 K/uL Final  . Eosinophils Relative 11/30/2013 2  0 - 5 % Final  . Eosinophils Absolute 11/30/2013 0.0  0.0 - 0.7 K/uL Final  . Basophils Relative 11/30/2013 2* 0 - 1 % Final  . Basophils Absolute 11/30/2013 0.0  0.0 - 0.1 K/uL Final  . Sodium 11/30/2013 133* 137 - 147 mEq/L Final  . Potassium 11/30/2013 3.9  3.7 - 5.3 mEq/L Final  . Chloride 11/30/2013 99  96 - 112 mEq/L Final  . CO2 11/30/2013 23  19 - 32 mEq/L Final  . Glucose, Bld 11/30/2013 247* 70 - 99 mg/dL Final  . BUN 11/30/2013 18  6 - 23 mg/dL Final  . Creatinine, Ser 11/30/2013 0.72  0.50 - 1.35 mg/dL Final  . Calcium 11/30/2013 9.1  8.4 - 10.5 mg/dL Final  . Total Protein 11/30/2013 6.4  6.0 - 8.3 g/dL Final  . Albumin 11/30/2013 2.6* 3.5 - 5.2 g/dL Final  . AST 11/30/2013 109* 0 - 37 U/L Final  . ALT 11/30/2013 123* 0 - 53 U/L Final  . Alkaline Phosphatase 11/30/2013 381* 39 - 117 U/L Final  . Total Bilirubin 11/30/2013 2.7* 0.3 - 1.2 mg/dL Final  . GFR calc non Af Amer 11/30/2013 >90  >90 mL/min Final  . GFR calc Af Amer 11/30/2013 >90  >90 mL/min Final   Comment: (NOTE)                          The eGFR has been calculated using the CKD EPI equation.                           This calculation has not been validated in all clinical situations.                          eGFR's persistently <90 mL/min signify possible Chronic Kidney                          Disease.  . Anion gap 11/30/2013 11  5 - 15 Final  Office Visit on 11/16/2013  Component Date Value Ref Range Status  . WBC 11/16/2013 3.2* 4.0 - 10.5 K/uL Final  . RBC 11/16/2013 4.75  4.22 - 5.81 MIL/uL Final  . Hemoglobin 11/16/2013 14.9  13.0 - 17.0 g/dL Final  . HCT 11/16/2013 44.0  39.0 - 52.0 % Final  . MCV 11/16/2013 92.6  78.0 - 100.0 fL Final  . MCH 11/16/2013 31.4  26.0 - 34.0 pg Final  . MCHC 11/16/2013 33.9  30.0 - 36.0 g/dL Final  . RDW 11/16/2013 15.5  11.5 - 15.5 % Final  . Platelets 11/16/2013 PLATELET CLUMPS NOTED ON SMEAR, COUNT APPEARS ADEQUATE  150 - 400 K/uL Final  . Neutrophils Relative % 11/16/2013 75  43 - 77 % Final  . Neutro Abs 11/16/2013 2.4  1.7 - 7.7 K/uL Final  . Lymphocytes Relative 11/16/2013 17  12 - 46 % Final  . Lymphs Abs 11/16/2013 0.5* 0.7 - 4.0 K/uL Final  . Monocytes Relative 11/16/2013 7  3 - 12 % Final  . Monocytes Absolute 11/16/2013 0.2  0.1 - 1.0 K/uL Final  . Eosinophils Relative 11/16/2013 2  0 - 5 % Final  . Eosinophils Absolute 11/16/2013 0.1  0.0 - 0.7 K/uL Final  . Basophils Relative 11/16/2013 0  0 - 1 % Final  .  Basophils Absolute 11/16/2013 0.0  0.0 - 0.1 K/uL Final  . Sodium 11/16/2013 140  137 - 147 mEq/L Final  . Potassium 11/16/2013 4.0  3.7 - 5.3 mEq/L Final  . Chloride 11/16/2013 105  96 - 112 mEq/L Final  . CO2 11/16/2013 25  19 - 32 mEq/L Final  . Glucose, Bld 11/16/2013 183* 70 - 99 mg/dL Final  . BUN 11/16/2013 15  6 - 23 mg/dL Final  . Creatinine, Ser 11/16/2013 0.78  0.50 - 1.35 mg/dL Final  . Calcium 11/16/2013 9.0  8.4 - 10.5 mg/dL Final  . Total Protein 11/16/2013 6.7  6.0 - 8.3 g/dL Final  . Albumin 11/16/2013 2.8* 3.5 - 5.2 g/dL Final  . AST 11/16/2013 51* 0 - 37 U/L Final  . ALT 11/16/2013 41  0 - 53 U/L Final  .  Alkaline Phosphatase 11/16/2013 343* 39 - 117 U/L Final  . Total Bilirubin 11/16/2013 1.3* 0.3 - 1.2 mg/dL Final  . GFR calc non Af Amer 11/16/2013 >90  >90 mL/min Final  . GFR calc Af Amer 11/16/2013 >90  >90 mL/min Final   Comment: (NOTE)                          The eGFR has been calculated using the CKD EPI equation.                          This calculation has not been validated in all clinical situations.                          eGFR's persistently <90 mL/min signify possible Chronic Kidney                          Disease.  . Anion gap 11/16/2013 10  5 - 15 Final  . LDH 11/16/2013 171  94 - 250 U/L Final   SLIGHT HEMOLYSIS  . Ammonia 11/16/2013 27  11 - 60 umol/L Final    PATHOLOGY:No new pathology.  Urinalysis No results found for this basename: colorurine,  appearanceur,  labspec,  phurine,  glucoseu,  hgbur,  bilirubinur,  ketonesur,  proteinur,  urobilinogen,  nitrite,  leukocytesur    RADIOGRAPHIC STUDIES: No results found.  ASSESSMENT:  #1. 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  #2. 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  #3. Nausea secondary to gastroparesis.  #4. Diabetes mellitus, type II, non-insulin requiring, controlled.  #5. Hypertension, controlled.  #6. Neutropenia and thrombocytopenia secondary to chemotherapy.      PLAN:  #1. Hold Gemzar today and adjust dose to 800 mg per meter squared for cycle 2. #2. Cipro 500 mg daily for 10 days as neutropenia prophylaxis. #3. Weight daily and call the gain 5 pounds. #4. Call if develops fever. #5. Administer cisplatin alone today. #6. Followup in one week with CBC and chem profile.   All questions were answered. The patient knows to call the clinic with any  problems, questions or concerns. We can certainly see the patient much sooner if necessary.   I spent 30 minutes counseling the patient face to face. The total time spent in the appointment was 40 minutes.    Doroteo Bradford, MD 11/30/2013 10:37  AM  DISCLAIMER:  This note was dictated with voice recognition software.  Similar sounding words can inadvertently be transcribed inaccurately and may not be corrected upon review.

## 2013-11-30 NOTE — Progress Notes (Signed)
Tolerated Cisplatin & fluids without any problems.

## 2013-11-30 NOTE — Progress Notes (Signed)
CRITICAL VALUE ALERT  Critical value received:  WBC 600  Date of notification:  11/30/13  Time of notification:  0937  Critical value read back:Yes.    Nurse who received alert:  A. Ouida Sills, RN  MD notified:  Dr. Barnet Glasgow  Time:  0131  Dr. Barnet Glasgow notified of results.  Order received to dose reduce Gemzar by 20% (800mg /m2) and to proceed with treatment.  Ofilia Neas, RN (primary nurse) notified of plan.

## 2013-11-30 NOTE — Patient Instructions (Signed)
..  [facility] Discharge Instructions  RECOMMENDATIONS MADE BY THE CONSULTANT AND ANY TEST RESULTS WILL BE SENT TO YOUR REFERRING PHYSICIAN.  EXAM FINDINGS BY THE PHYSICIAN TODAY AND SIGNS OR SYMPTOMS TO REPORT TO CLINIC OR PRIMARY PHYSICIAN: Exam and findings as discussed by Dr. Barnet Glasgow. Your white blood count and your platelets are low and we will hold the gemzar and just give you cisplatin today. With the next treatment you will get gemzar at a reduced dose. MEDICATIONS PRESCRIBED:  Cipro 500 mg for 10 days (preventative)  INSTRUCTIONS/FOLLOW-UP: Return next week for Dr. Visit and then in 2 weeks for treatment  Thank you for choosing [facility] to provide your oncology and hematology care.  To afford each patient quality time with our providers, please arrive at least 15 minutes before your scheduled appointment time.  With your help, our goal is to use those 15 minutes to complete the necessary work-up to ensure our physicians have the information they need to help with your evaluation and healthcare recommendations.    Effective January 1st, 2014, we ask that you re-schedule your appointment with our physicians should you arrive 10 or more minutes late for your appointment.  We strive to give you quality time with our providers, and arriving late affects you and other patients whose appointments are after yours.    Again, thank you for choosing [facility].  Our hope is that these requests will decrease the amount of time that you wait before being seen by our physicians.       _____________________________________________________________  Should you have questions after your visit to [facility], please contact our office at [phone] between the hours of 8:30 a.m. and 4:30 p.m.  Voicemails left after 4:30 p.m. will not be returned until the following business day.  For prescription refill requests, have your pharmacy  contact our office with your prescription refill request.    _______________________________________________________________  We hope that we have given you very good care.  You may receive a patient satisfaction survey in the mail, please complete it and return it as soon as possible.  We value your feedback!  _______________________________________________________________  Have you asked about our STAR program?  STAR stands for Survivorship Training and Rehabilitation, and this is a nationally recognized cancer care program that focuses on survivorship and rehabilitation.  Cancer and cancer treatments may cause problems, such as, pain, making you feel tired and keeping you from doing the things that you need or want to do. Cancer rehabilitation can help. Our goal is to reduce these troubling effects and help you have the best quality of life possible.  You may receive a survey from a nurse that asks questions about your current state of health.  Based on the survey results, all eligible patients will be referred to the [facility] program for an evaluation so we can better serve you!  A frequently asked questions sheet is available upon request.

## 2013-12-07 ENCOUNTER — Encounter (HOSPITAL_BASED_OUTPATIENT_CLINIC_OR_DEPARTMENT_OTHER): Payer: 59

## 2013-12-07 ENCOUNTER — Telehealth (HOSPITAL_COMMUNITY): Payer: Self-pay

## 2013-12-07 ENCOUNTER — Encounter (HOSPITAL_COMMUNITY): Payer: Self-pay

## 2013-12-07 VITALS — BP 117/76 | HR 92 | Temp 98.1°F | Resp 18 | Wt 211.1 lb

## 2013-12-07 DIAGNOSIS — K3184 Gastroparesis: Secondary | ICD-10-CM

## 2013-12-07 DIAGNOSIS — C3411 Malignant neoplasm of upper lobe, right bronchus or lung: Secondary | ICD-10-CM

## 2013-12-07 DIAGNOSIS — C221 Intrahepatic bile duct carcinoma: Secondary | ICD-10-CM

## 2013-12-07 DIAGNOSIS — C3410 Malignant neoplasm of upper lobe, unspecified bronchus or lung: Secondary | ICD-10-CM | POA: Diagnosis not present

## 2013-12-07 LAB — CBC WITH DIFFERENTIAL/PLATELET
BASOS ABS: 0 10*3/uL (ref 0.0–0.1)
BASOS PCT: 0 % (ref 0–1)
Eosinophils Absolute: 0 10*3/uL (ref 0.0–0.7)
Eosinophils Relative: 3 % (ref 0–5)
HCT: 35.4 % — ABNORMAL LOW (ref 39.0–52.0)
Hemoglobin: 11.8 g/dL — ABNORMAL LOW (ref 13.0–17.0)
Lymphocytes Relative: 57 % — ABNORMAL HIGH (ref 12–46)
Lymphs Abs: 0.4 10*3/uL — ABNORMAL LOW (ref 0.7–4.0)
MCH: 31.1 pg (ref 26.0–34.0)
MCHC: 33.3 g/dL (ref 30.0–36.0)
MCV: 93.4 fL (ref 78.0–100.0)
Monocytes Absolute: 0.2 10*3/uL (ref 0.1–1.0)
Monocytes Relative: 20 % — ABNORMAL HIGH (ref 3–12)
Neutro Abs: 0.2 10*3/uL — ABNORMAL LOW (ref 1.7–7.7)
Neutrophils Relative %: 21 % — ABNORMAL LOW (ref 43–77)
PLATELETS: 57 10*3/uL — AB (ref 150–400)
RBC: 3.79 MIL/uL — ABNORMAL LOW (ref 4.22–5.81)
RDW: 13.8 % (ref 11.5–15.5)
WBC: 0.8 10*3/uL — AB (ref 4.0–10.5)

## 2013-12-07 LAB — COMPREHENSIVE METABOLIC PANEL
ALT: 72 U/L — AB (ref 0–53)
AST: 56 U/L — AB (ref 0–37)
Albumin: 2.8 g/dL — ABNORMAL LOW (ref 3.5–5.2)
Alkaline Phosphatase: 333 U/L — ABNORMAL HIGH (ref 39–117)
Anion gap: 11 (ref 5–15)
BUN: 18 mg/dL (ref 6–23)
CALCIUM: 9.1 mg/dL (ref 8.4–10.5)
CO2: 25 mEq/L (ref 19–32)
Chloride: 102 mEq/L (ref 96–112)
Creatinine, Ser: 0.9 mg/dL (ref 0.50–1.35)
GFR calc Af Amer: >90 mL/min (ref >90)
GFR calc non Af Amer: 85 mL/min — ABNORMAL LOW (ref >90)
Glucose, Bld: 236 mg/dL — ABNORMAL HIGH (ref 70–99)
Potassium: 4.2 mEq/L (ref 3.7–5.3)
SODIUM: 138 meq/L (ref 137–147)
TOTAL PROTEIN: 6.2 g/dL (ref 6.0–8.3)
Total Bilirubin: 1.3 mg/dL — ABNORMAL HIGH (ref 0.3–1.2)

## 2013-12-07 NOTE — Patient Instructions (Signed)
[place] Discharge Instructions  RECOMMENDATIONS MADE BY THE CONSULTANT AND ANY TEST RESULTS WILL BE SENT TO YOUR REFERRING PHYSICIAN.  EXAM FINDINGS BY THE PHYSICIAN TODAY AND SIGNS OR SYMPTOMS TO REPORT TO CLINIC OR PRIMARY PHYSICIAN: Exam and findings as discussed by Dr. Barnet Glasgow.   Use antiacid for your chest discomfort. Increase your Metformin to 500 mg twice daily Report fevers, chills, uncontrolled nausea, vomiting or other problems.   INSTRUCTIONS/FOLLOW-UP: Follow-up in 1 week.  Thank you for choosing [facility] to provide your oncology and hematology care.  To afford each patient quality time with our providers, please arrive at least 15 minutes before your scheduled appointment time.  With your help, our goal is to use those 15 minutes to complete the necessary work-up to ensure our physicians have the information they need to help with your evaluation and healthcare recommendations.    Effective January 1st, 2014, we ask that you re-schedule your appointment with our physicians should you arrive 10 or more minutes late for your appointment.  We strive to give you quality time with our providers, and arriving late affects you and other patients whose appointments are after yours.    Again, thank you for choosing [facility].  Our hope is that these requests will decrease the amount of time that you wait before being seen by our physicians.       _____________________________________________________________  Should you have questions after your visit to [facility], please contact our office at [phone] between the hours of 8:30 a.m. and 4:30 p.m.  Voicemails left after 4:30 p.m. will not be returned until the following business day.  For prescription refill requests, have your pharmacy contact our office with your prescription refill request.     _______________________________________________________________  We hope that we have given you very good care.  You may receive a patient satisfaction survey in the mail, please complete it and return it as soon as possible.  We value your feedback!  _______________________________________________________________  Have you asked about our STAR program?  STAR stands for Survivorship Training and Rehabilitation, and this is a nationally recognized cancer care program that focuses on survivorship and rehabilitation.  Cancer and cancer treatments may cause problems, such as, pain, making you feel tired and keeping you from doing the things that you need or want to do. Cancer rehabilitation can help. Our goal is to reduce these troubling effects and help you have the best quality of life possible.  You may receive a survey from a nurse that asks questions about your current state of health.  Based on the survey results, all eligible patients will be referred to the [facility] program for an evaluation so we can better serve you!  A frequently asked questions sheet is available upon request.

## 2013-12-07 NOTE — Telephone Encounter (Signed)
CRITICAL VALUE ALERT Critical value received:  WBC 0.8 Date of notification:  12/07/13  Time of notification: 10:30am Critical value read back:  Yes.   Nurse who received alert:  Mickie Kay, RN MD notified (1st page):  Dr. Barnet Glasgow @ 10:30  Patient instructed to observe neutropenic precautions - good hand washing, avoiding crowds, avoiding people who are sick, etc.

## 2013-12-07 NOTE — Progress Notes (Signed)
LABS DRAWN FOR CBCD,CMP 

## 2013-12-07 NOTE — Progress Notes (Signed)
Bryce Daugherty  OFFICE PROGRESS NOTE  Bryce Bogus, MD [name] [id number]  DIAGNOSIS: Intrahepatic cholangiocarcinoma  Malignant neoplasm of upper lobe of right lung  Chief Complaint  Patient presents with  . Follow-up  . Cholangiocarcinoma  . EGFR mutated lung cancer    CURRENT THERAPY: Gemzar/cisplatin cycle 1 day 8 completed one week ago, next treatment in 2 weeks for progressive cholangiocarcinoma. Afatinib 30 mg daily for the last 9 days for squamous cell carcinoma of the lung  INTERVAL HISTORY: Bryce Daugherty 68 y.o. male returns for followup of cholangiocarcinoma with periportal lymphadenopathy in addition to separate adenocarcinoma lung , the latter treated with SBRT x3 on 10/02/2013, 10/04/2013, and 10/05/2013., status post biliary tract stenting with replacement of stent most recently on 10/12/2013 after recurrence of jaundice. Received first cycle of cisplatin/Gemzar day 1 and 8 every 21 days with day 8 of cycle 1 treatment completed 1 week ago receiving cisplatin alone because of neutropenia.. Continues on Afatinib 30 mg daily. He feels more fatigued but had no fever during the week. Weight is ranges around 106 pounds. He said some thickening of the vein use to treat him last week. There is no associated pain or redness. Blood sugars are in the range of 160 mg percent in the morning. He denies any PND, orthopnea, or palpitations but does have occasional chest discomfort which is relieved by lying on his right side.    MEDICAL HISTORY: Past Medical History  Diagnosis Date  . Hypertension   . Chronic kidney disease   . Arthritis     lower back, hips knees  . Kidney stones     bilateral stones pain since 04/11/11  . Diabetes mellitus     treatment for 2 years  . 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 Adenocarcinoma, lung     s/p radiation treatments    INTERIM HISTORY: has Ureteral stone; Dilation  of biliary tract; Jaundice; Loss of weight; Obstructive jaundice; Type II or unspecified type diabetes mellitus without mention of complication, not stated as uncontrolled; Unspecified essential hypertension; Lung cancer, upper lobe; Intrahepatic cholangiocarcinoma; and Ascites on his problem list.    ALLERGIES:  has No Known Allergies.  MEDICATIONS: has a current medication list which includes the following prescription(s): acetaminophen, afatinib dimaleate, aspirin ec, atenolol, ciprofloxacin, doxazosin, glipizide, metformin, metoclopramide, omeprazole, and prochlorperazine.  SURGICAL HISTORY:  Past Surgical History  Procedure Laterality Date  . Cystectomy  under left arm  . Colonoscopy  03/24/2011    CWU:GQBVQXIH hemorrhoids  . Cholecystectomy      1984  . Catarac both eyes    . Tonsillectomy    . Ercp N/A 08/03/2013    Dr. Gala Romney: with sphincterotomy, balloon dilation, stone extraction, plastic stent placement. Tbili 26 pre-operatively  . Sphincterotomy N/A 08/03/2013    Procedure: SPHINCTEROTOMY;  Surgeon: Daneil Dolin, MD;  Location: [facility];  Service: Endoscopy;  Laterality: N/A;  with dilation   . Biliary stent placement N/A 08/03/2013    Procedure: BILIARY STENT PLACEMENT;  Surgeon: Daneil Dolin, MD;  Location: [facility];  Service: Endoscopy;  Laterality: N/A;  . Removal of stones N/A 08/03/2013    Procedure: REMOVAL OF STONES;  Surgeon: Daneil Dolin, MD;  Location: [facility];  Service: Endoscopy;  Laterality: N/A;  . Ercp N/A 10/12/2013    Procedure: ENDOSCOPIC RETROGRADE CHOLANGIOPANCREATOGRAPHY (ERCP);  Surgeon: Daneil Dolin, MD;  Location: [facility];  Service: Endoscopy;  Laterality: N/A;  . Biliary stent placement N/A 10/12/2013    Procedure: BILIARY STENT REPLACEMENT;  Surgeon: Daneil Dolin, MD;  Location: [facility];  Service: Endoscopy;  Laterality: N/A;  . Lung biopsy  08/31/13    FAMILY HISTORY: family history includes Heart attack in his father; Pancreatic cancer in his mother.  There is no history of Colon cancer.  SOCIAL HISTORY:  reports that he quit smoking about 25 years ago. His smoking use included Cigarettes. He has a 20 pack-year smoking history. He has never used smokeless tobacco. He reports that he does not drink alcohol or use illicit drugs.  REVIEW OF SYSTEMS:  Other than that discussed above is noncontributory.  PHYSICAL EXAMINATION: ECOG PERFORMANCE STATUS: 1 - Symptomatic but completely ambulatory  Blood pressure 117/76, pulse 92, temperature 98.1 F (36.7 C), temperature source Oral, resp. rate 18, weight 211 lb 1.6 oz (95.754 kg), SpO2 99.00%.  GENERAL:alert, no distress and comfortable SKIN: skin color, texture, turgor are normal, no rashes or significant lesions EYES: PERLA; Conjunctiva are pink and non-injected, sclera clear SINUSES: No redness or tenderness over maxillary or ethmoid sinuses OROPHARYNX:no exudate, no erythema on lips, buccal mucosa, or tongue. NECK: supple, thyroid normal size, non-tender, without nodularity. No masses CHEST: Normal AP diameter with no breast masses. LYMPH:  no palpable lymphadenopathy in the cervical, axillary or inguinal LUNGS: clear to auscultation and percussion with normal breathing effort HEART: regular rate & rhythm and no murmurs. ABDOMEN:abdomen soft, non-tender and normal bowel sounds. Distended with positive fluid wave and shifting dullness. No CVA tenderness. MUSCULOSKELETAL:no cyanosis of digits and no clubbing. Range of motion normal. Right upper extremity with sclerosis of the vein used to deliver chemotherapy last week. NEURO: alert & oriented x 3 with fluent speech, no focal motor/sensory deficits. No asterixis.   LABORATORY DATA: Lab on 12/07/2013  Component Date Value Ref Range Status  . WBC 12/07/2013 0.8* 4.0 - 10.5 K/uL Final   Comment: EPITHELIAL CASTS                          WHITE COUNT CONFIRMED ON SMEAR                          CRITICAL RESULT CALLED TO, READ BACK BY AND  VERIFIED WITH:                          SUE BURGESS RN ON [id number] AT 1030 BY RESSEGGER R  . RBC 12/07/2013 3.79* 4.22 - 5.81 MIL/uL Final  . Hemoglobin 12/07/2013 11.8* 13.0 - 17.0 g/dL Final  . HCT 12/07/2013 35.4* 39.0 - 52.0 % Final  . MCV 12/07/2013 93.4  78.0 - 100.0 fL Final  . MCH 12/07/2013 31.1  26.0 - 34.0 pg Final  . MCHC 12/07/2013 33.3  30.0 - 36.0 g/dL Final  . RDW 12/07/2013 13.8  11.5 - 15.5 % Final  . Platelets 12/07/2013 57* 150 - 400 K/uL Final  . Neutrophils Relative % 12/07/2013 21* 43 - 77 % Final  . Neutro Abs 12/07/2013 0.2* 1.7 - 7.7 K/uL Final  . Lymphocytes Relative 12/07/2013 57* 12 - 46 % Final  . Lymphs Abs 12/07/2013 0.4* 0.7 - 4.0 K/uL Final  . Monocytes Relative 12/07/2013 20* 3 - 12 % Final  . Monocytes Absolute 12/07/2013 0.2  0.1 - 1.0 K/uL Final  . Eosinophils Relative 12/07/2013 3  0 - 5 % Final  . Eosinophils Absolute 12/07/2013 0.0  0.0 - 0.7 K/uL Final  . Basophils Relative 12/07/2013 0  0 - 1 % Final  . Basophils Absolute 12/07/2013 0.0  0.0 - 0.1 K/uL Final  . Sodium 12/07/2013 138  137 - 147 mEq/L Final  . Potassium 12/07/2013 4.2  3.7 - 5.3 mEq/L Final  . Chloride 12/07/2013 102  96 - 112 mEq/L Final  . CO2 12/07/2013 25  19 - 32 mEq/L Final  . Glucose, Bld 12/07/2013 236* 70 - 99 mg/dL Final  . BUN 12/07/2013 18  6 - 23 mg/dL Final  . Creatinine, Ser 12/07/2013 0.90  0.50 - 1.35 mg/dL Final  . Calcium 12/07/2013 9.1  8.4 - 10.5 mg/dL Final  . Total Protein 12/07/2013 6.2  6.0 - 8.3 g/dL Final  . Albumin 12/07/2013 2.8* 3.5 - 5.2 g/dL Final  . AST 12/07/2013 56* 0 - 37 U/L Final  . ALT 12/07/2013 72* 0 - 53 U/L Final  . Alkaline Phosphatase 12/07/2013 333* 39 - 117 U/L Final  . Total Bilirubin 12/07/2013 1.3* 0.3 - 1.2 mg/dL Final  . GFR calc non Af Amer 12/07/2013 85* >90 mL/min Final  . GFR calc Af Amer 12/07/2013 >90  >90 mL/min Final   Comment: (NOTE)                          The eGFR has been calculated using the CKD EPI equation.                            This calculation has not been validated in all clinical situations.                          eGFR's persistently <90 mL/min signify possible Chronic Kidney                          Disease.  . Anion gap 12/07/2013 11  5 - 15 Final  Infusion on 11/30/2013  Component Date Value Ref Range Status  . WBC 11/30/2013 0.6* 4.0 - 10.5 K/uL Final   Comment: RESULT REPEATED AND VERIFIED                          WHITE COUNT CONFIRMED ON SMEAR                          CRITICAL RESULT CALLED TO, READ BACK BY AND VERIFIED WITH:                          Caren Macadam ON [id number] AT 0935 BY RESSEGGER R  . RBC 11/30/2013 3.86* 4.22 - 5.81 MIL/uL Final  . Hemoglobin 11/30/2013 12.3* 13.0 - 17.0 g/dL Final  . HCT 11/30/2013 35.6* 39.0 - 52.0 % Final  . MCV 11/30/2013 92.2  78.0 - 100.0 fL Final  . MCH 11/30/2013 31.9  26.0 - 34.0 pg Final  . MCHC 11/30/2013 34.6  30.0 - 36.0 g/dL Final  . RDW 11/30/2013 13.6  11.5 - 15.5 % Final  . Platelets 11/30/2013 47* 150 - 400 K/uL Final   Comment: RESULT REPEATED AND VERIFIED  PLATELET COUNT CONFIRMED BY SMEAR                          SPECIMEN CHECKED FOR CLOTS  . Neutrophils Relative % 11/30/2013 40* 43 - 77 % Final  . Neutro Abs 11/30/2013 0.3* 1.7 - 7.7 K/uL Final  . Lymphocytes Relative 11/30/2013 56* 12 - 46 % Final  . Lymphs Abs 11/30/2013 0.4* 0.7 - 4.0 K/uL Final  . Monocytes Relative 11/30/2013 2* 3 - 12 % Final  . Monocytes Absolute 11/30/2013 0.0* 0.1 - 1.0 K/uL Final  . Eosinophils Relative 11/30/2013 2  0 - 5 % Final  . Eosinophils Absolute 11/30/2013 0.0  0.0 - 0.7 K/uL Final  . Basophils Relative 11/30/2013 2* 0 - 1 % Final  . Basophils Absolute 11/30/2013 0.0  0.0 - 0.1 K/uL Final  . Sodium 11/30/2013 133* 137 - 147 mEq/L Final  . Potassium 11/30/2013 3.9  3.7 - 5.3 mEq/L Final  . Chloride 11/30/2013 99  96 - 112 mEq/L Final  . CO2 11/30/2013 23  19 - 32 mEq/L Final  . Glucose, Bld 11/30/2013  247* 70 - 99 mg/dL Final  . BUN 11/30/2013 18  6 - 23 mg/dL Final  . Creatinine, Ser 11/30/2013 0.72  0.50 - 1.35 mg/dL Final  . Calcium 11/30/2013 9.1  8.4 - 10.5 mg/dL Final  . Total Protein 11/30/2013 6.4  6.0 - 8.3 g/dL Final  . Albumin 11/30/2013 2.6* 3.5 - 5.2 g/dL Final  . AST 11/30/2013 109* 0 - 37 U/L Final  . ALT 11/30/2013 123* 0 - 53 U/L Final  . Alkaline Phosphatase 11/30/2013 381* 39 - 117 U/L Final  . Total Bilirubin 11/30/2013 2.7* 0.3 - 1.2 mg/dL Final  . GFR calc non Af Amer 11/30/2013 >90  >90 mL/min Final  . GFR calc Af Amer 11/30/2013 >90  >90 mL/min Final   Comment: (NOTE)                          The eGFR has been calculated using the CKD EPI equation.                          This calculation has not been validated in all clinical situations.                          eGFR's persistently <90 mL/min signify possible Chronic Kidney                          Disease.  . Anion gap 11/30/2013 11  5 - 15 Final  Office Visit on 11/16/2013  Component Date Value Ref Range Status  . WBC 11/16/2013 3.2* 4.0 - 10.5 K/uL Final  . RBC 11/16/2013 4.75  4.22 - 5.81 MIL/uL Final  . Hemoglobin 11/16/2013 14.9  13.0 - 17.0 g/dL Final  . HCT 11/16/2013 44.0  39.0 - 52.0 % Final  . MCV 11/16/2013 92.6  78.0 - 100.0 fL Final  . MCH 11/16/2013 31.4  26.0 - 34.0 pg Final  . MCHC 11/16/2013 33.9  30.0 - 36.0 g/dL Final  . RDW 11/16/2013 15.5  11.5 - 15.5 % Final  . Platelets 11/16/2013 PLATELET CLUMPS NOTED ON SMEAR, COUNT APPEARS ADEQUATE  150 - 400 K/uL Final  . Neutrophils Relative % 11/16/2013 75  43 - 77 % Final  .  Neutro Abs 11/16/2013 2.4  1.7 - 7.7 K/uL Final  . Lymphocytes Relative 11/16/2013 17  12 - 46 % Final  . Lymphs Abs 11/16/2013 0.5* 0.7 - 4.0 K/uL Final  . Monocytes Relative 11/16/2013 7  3 - 12 % Final  . Monocytes Absolute 11/16/2013 0.2  0.1 - 1.0 K/uL Final  . Eosinophils Relative 11/16/2013 2  0 - 5 % Final  . Eosinophils Absolute 11/16/2013 0.1  0.0 - 0.7 K/uL  Final  . Basophils Relative 11/16/2013 0  0 - 1 % Final  . Basophils Absolute 11/16/2013 0.0  0.0 - 0.1 K/uL Final  . Sodium 11/16/2013 140  137 - 147 mEq/L Final  . Potassium 11/16/2013 4.0  3.7 - 5.3 mEq/L Final  . Chloride 11/16/2013 105  96 - 112 mEq/L Final  . CO2 11/16/2013 25  19 - 32 mEq/L Final  . Glucose, Bld 11/16/2013 183* 70 - 99 mg/dL Final  . BUN 11/16/2013 15  6 - 23 mg/dL Final  . Creatinine, Ser 11/16/2013 0.78  0.50 - 1.35 mg/dL Final  . Calcium 11/16/2013 9.0  8.4 - 10.5 mg/dL Final  . Total Protein 11/16/2013 6.7  6.0 - 8.3 g/dL Final  . Albumin 11/16/2013 2.8* 3.5 - 5.2 g/dL Final  . AST 11/16/2013 51* 0 - 37 U/L Final  . ALT 11/16/2013 41  0 - 53 U/L Final  . Alkaline Phosphatase 11/16/2013 343* 39 - 117 U/L Final  . Total Bilirubin 11/16/2013 1.3* 0.3 - 1.2 mg/dL Final  . GFR calc non Af Amer 11/16/2013 >90  >90 mL/min Final  . GFR calc Af Amer 11/16/2013 >90  >90 mL/min Final   Comment: (NOTE)                          The eGFR has been calculated using the CKD EPI equation.                          This calculation has not been validated in all clinical situations.                          eGFR's persistently <90 mL/min signify possible Chronic Kidney                          Disease.  . Anion gap 11/16/2013 10  5 - 15 Final  . LDH 11/16/2013 171  94 - 250 U/L Final   SLIGHT HEMOLYSIS  . Ammonia 11/16/2013 27  11 - 60 umol/L Final    PATHOLOGY: No new pathology. Lung cancer is EGFR mutated.  Urinalysis No results found for this basename: colorurine,  appearanceur,  labspec,  phurine,  glucoseu,  hgbur,  bilirubinur,  ketonesur,  proteinur,  urobilinogen,  nitrite,  leukocytesur    RADIOGRAPHIC STUDIES: No results found.  ASSESSMENT:  #1. 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  #2. 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 #3. Nausea secondary to gastroparesis.  #4. Diabetes mellitus, type II, non-insulin requiring, not controlled.  #5. Hypertension, controlled.  #6. Persistent neutropenia and thrombocytopenia from chemotherapy.    PLAN:  #1. The patient and his wife were both reassured. #2. Increase metformin to 500 mg  twice a day and continue glipizide 4 mg daily. #3. Monitor temperature. Call if elevated to 100 or above so that antibiotics could be started. #4. Followup in one week to begin cycle #2 of chemotherapy utilizing lower dose of Gemzar at 800 mg per meter squared.  All questions were answered. The patient knows to call the clinic with any problems, questions or concerns. We can certainly see the patient much sooner if necessary.   I spent 30 minutes counseling the patient face to face. The total time spent in the appointment was 40 minutes.    Doroteo Bradford, MD 12/07/2013 10:39 AM  DISCLAIMER:  This note was dictated with voice recognition software.  Similar sounding words can inadvertently be transcribed inaccurately and may not be corrected upon review.

## 2013-12-08 ENCOUNTER — Telehealth (HOSPITAL_COMMUNITY): Payer: Self-pay

## 2013-12-08 NOTE — Telephone Encounter (Signed)
Call from patient, had some diarrhea this am.  Thinks it may be due to the increase in the Metformin dosage.  Took OTC anti-diarrheal.  Instructed to call back Monday if diarrhea worsens.  Denies fevers or any other symptoms.

## 2013-12-14 ENCOUNTER — Encounter (HOSPITAL_COMMUNITY): Payer: Self-pay

## 2013-12-14 ENCOUNTER — Encounter (HOSPITAL_BASED_OUTPATIENT_CLINIC_OR_DEPARTMENT_OTHER): Payer: 59

## 2013-12-14 VITALS — BP 114/71 | HR 83 | Temp 97.9°F | Resp 16 | Wt 212.0 lb

## 2013-12-14 DIAGNOSIS — C221 Intrahepatic bile duct carcinoma: Secondary | ICD-10-CM

## 2013-12-14 DIAGNOSIS — C3411 Malignant neoplasm of upper lobe, right bronchus or lung: Secondary | ICD-10-CM

## 2013-12-14 DIAGNOSIS — C3410 Malignant neoplasm of upper lobe, unspecified bronchus or lung: Secondary | ICD-10-CM

## 2013-12-14 DIAGNOSIS — Z5111 Encounter for antineoplastic chemotherapy: Secondary | ICD-10-CM

## 2013-12-14 DIAGNOSIS — K4090 Unilateral inguinal hernia, without obstruction or gangrene, not specified as recurrent: Secondary | ICD-10-CM

## 2013-12-14 LAB — COMPREHENSIVE METABOLIC PANEL
ALBUMIN: 2.7 g/dL — AB (ref 3.5–5.2)
ALT: 43 U/L (ref 0–53)
AST: 49 U/L — ABNORMAL HIGH (ref 0–37)
Alkaline Phosphatase: 322 U/L — ABNORMAL HIGH (ref 39–117)
Anion gap: 12 (ref 5–15)
BUN: 18 mg/dL (ref 6–23)
CHLORIDE: 103 meq/L (ref 96–112)
CO2: 23 mEq/L (ref 19–32)
Calcium: 8.7 mg/dL (ref 8.4–10.5)
Creatinine, Ser: 0.82 mg/dL (ref 0.50–1.35)
GFR calc Af Amer: >90 mL/min (ref >90)
GFR calc non Af Amer: 89 mL/min — ABNORMAL LOW (ref >90)
Glucose, Bld: 177 mg/dL — ABNORMAL HIGH (ref 70–99)
POTASSIUM: 3.6 meq/L — AB (ref 3.7–5.3)
SODIUM: 138 meq/L (ref 137–147)
TOTAL PROTEIN: 6.3 g/dL (ref 6.0–8.3)
Total Bilirubin: 1.1 mg/dL (ref 0.3–1.2)

## 2013-12-14 LAB — CBC WITH DIFFERENTIAL/PLATELET
Basophils Absolute: 0 10*3/uL (ref 0.0–0.1)
Basophils Relative: 0 % (ref 0–1)
Eosinophils Absolute: 0 10*3/uL (ref 0.0–0.7)
Eosinophils Relative: 1 % (ref 0–5)
HCT: 37.8 % — ABNORMAL LOW (ref 39.0–52.0)
HEMOGLOBIN: 12.8 g/dL — AB (ref 13.0–17.0)
LYMPHS PCT: 20 % (ref 12–46)
Lymphs Abs: 0.5 10*3/uL — ABNORMAL LOW (ref 0.7–4.0)
MCH: 31.9 pg (ref 26.0–34.0)
MCHC: 33.9 g/dL (ref 30.0–36.0)
MCV: 94.3 fL (ref 78.0–100.0)
MONOS PCT: 14 % — AB (ref 3–12)
Monocytes Absolute: 0.3 10*3/uL (ref 0.1–1.0)
NEUTROS ABS: 1.5 10*3/uL — AB (ref 1.7–7.7)
NEUTROS PCT: 65 % (ref 43–77)
Platelets: 139 10*3/uL — ABNORMAL LOW (ref 150–400)
RBC: 4.01 MIL/uL — ABNORMAL LOW (ref 4.22–5.81)
RDW: 14.5 % (ref 11.5–15.5)
WBC: 2.4 10*3/uL — ABNORMAL LOW (ref 4.0–10.5)

## 2013-12-14 LAB — RETICULOCYTES
RBC.: 4.01 MIL/uL — AB (ref 4.22–5.81)
RETIC COUNT ABSOLUTE: 60.2 10*3/uL (ref 19.0–186.0)
Retic Ct Pct: 1.5 % (ref 0.4–3.1)

## 2013-12-14 MED ORDER — METOCLOPRAMIDE HCL 5 MG PO TABS: ORAL_TABLET | ORAL | Status: AC

## 2013-12-14 MED ORDER — SODIUM CHLORIDE 0.9 % IV SOLN
25.0000 mg/m2 | Freq: Once | INTRAVENOUS | Status: AC
  Administered 2013-12-14: 54 mg via INTRAVENOUS
  Filled 2013-12-14: qty 54

## 2013-12-14 MED ORDER — SODIUM CHLORIDE 0.9 % IV SOLN
INTRAVENOUS | Status: DC
  Administered 2013-12-14: 09:00:00 via INTRAVENOUS

## 2013-12-14 MED ORDER — METFORMIN HCL 500 MG PO TABS: ORAL_TABLET | ORAL | Status: DC

## 2013-12-14 MED ORDER — SODIUM CHLORIDE 0.9 % IJ SOLN
10.0000 mL | INTRAMUSCULAR | Status: DC | PRN
  Administered 2013-12-14: 10 mL

## 2013-12-14 MED ORDER — POTASSIUM CHLORIDE 2 MEQ/ML IV SOLN
Freq: Once | INTRAVENOUS | Status: AC
  Administered 2013-12-14: 10:00:00 via INTRAVENOUS
  Filled 2013-12-14: qty 10

## 2013-12-14 MED ORDER — PALONOSETRON HCL INJECTION 0.25 MG/5ML
0.2500 mg | Freq: Once | INTRAVENOUS | Status: AC
  Administered 2013-12-14: 0.25 mg via INTRAVENOUS
  Filled 2013-12-14: qty 5

## 2013-12-14 MED ORDER — OMEPRAZOLE 20 MG PO CPDR: DELAYED_RELEASE_CAPSULE | ORAL | Status: DC

## 2013-12-14 MED ORDER — SODIUM CHLORIDE 0.9 % IV SOLN
800.0000 mg/m2 | Freq: Once | INTRAVENOUS | Status: AC
  Administered 2013-12-14: 1748 mg via INTRAVENOUS
  Filled 2013-12-14: qty 45.97

## 2013-12-14 MED ORDER — SODIUM CHLORIDE 0.9 % IV SOLN
Freq: Once | INTRAVENOUS | Status: AC
  Administered 2013-12-14: 09:00:00 via INTRAVENOUS
  Filled 2013-12-14: qty 5

## 2013-12-14 NOTE — Progress Notes (Signed)
Tolerated chemotherapy well.

## 2013-12-14 NOTE — Progress Notes (Signed)
Organ  OFFICE PROGRESS NOTE  Bryce Bogus, MD [place] [address]  DIAGNOSIS: Intrahepatic cholangiocarcinoma - Plan: CBC with Differential, Reticulocytes, Comprehensive metabolic panel  Malignant neoplasm of upper lobe of right lung  Hernia, inguinal, right  Chief Complaint  Patient presents with  . Cholangiocarcinoma  . 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 Gemzar/cisplatin cycle 2 day 1 to today for progressive cholangiocarcinoma.  INTERVAL HISTORY: Bryce Daugherty 68 y.o. male returns for continuation of intravenous chemotherapy while taking Afatinib 30 mg daily treating 2 malignancies including EGFR mutated adenocarcinoma lung with metastases to lung and progressive cholangiocarcinoma, intrahepatic. He is status post SBRT x3 to the initial primary lung lesion on 10/02/2013, 10/04/2013, and 10/05/2013 all delivered at [facility]. He also had replacement of a previously inserted biliary stent with the last replacement performed on 10/12/2013.  Right inguinal hernia has been more painful but not incarcerated. Rashes develop on his scalp with episodes of diarrhea as well. He denies any cough or wheezing but is fatigued. Abdominal distention is no worse. He denies any fever. Lower 70 swelling is not present. He denies any PND, orthopnea, or palpitations. Last fasting blood sugar measured by the patient was 179 mg percent.  MEDICAL HISTORY: Past Medical History  Diagnosis Date  . Hypertension   . Chronic kidney disease   . Arthritis     lower back, hips knees  . Kidney stones     bilateral stones pain since 04/11/11  . Diabetes mellitus     treatment for 2 years  . 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 Adenocarcinoma, lung     s/p radiation treatments     INTERIM HISTORY: has Ureteral stone; Dilation of biliary tract; Jaundice; Loss of weight; Obstructive jaundice; Type II or unspecified type diabetes mellitus without mention of complication, not stated as uncontrolled; Unspecified essential hypertension; Lung cancer, upper lobe; Intrahepatic cholangiocarcinoma; and Ascites on his problem list.    ALLERGIES:  has No Known Allergies.  MEDICATIONS: has a current medication list which includes the following prescription(s): acetaminophen, afatinib dimaleate, aspirin ec, atenolol, ciprofloxacin, doxazosin, glipizide, metformin, metoclopramide, omeprazole, and prochlorperazine, and the following Facility-Administered Medications: sodium chloride, CISplatin (PLATINOL) 54 mg in sodium chloride 0.9 % 250 mL chemo infusion, dextrose 5 % and 0.45% NaCl 1,000 mL with potassium chloride 20 mEq, magnesium sulfate 12 mEq, mannitol 12.5 g infusion, Gemcitabine HCl (GEMZAR) 1,748 mg in sodium chloride 0.9 % 100 mL chemo infusion, and sodium chloride.  SURGICAL HISTORY:  Past Surgical History  Procedure Laterality Date  . Cystectomy  under left arm  . Colonoscopy  03/24/2011    HKV:[id number] hemorrhoids  . Cholecystectomy      1984  . Catarac both eyes    . Tonsillectomy    . Ercp N/A 08/03/2013    Dr. Gala Romney: with sphincterotomy, balloon dilation, stone extraction, plastic stent placement. Tbili 26 pre-operatively  . Sphincterotomy N/A 08/03/2013    Procedure: SPHINCTEROTOMY;  Surgeon: Daneil Dolin, MD;  Location: [facility];  Service: Endoscopy;  Laterality: N/A;  with dilation   . Biliary stent placement N/A 08/03/2013    Procedure: BILIARY STENT PLACEMENT;  Surgeon: Daneil Dolin, MD;  Location: [facility];  Service: Endoscopy;  Laterality: N/A;  . Removal of stones N/A 08/03/2013  Procedure: REMOVAL OF STONES;  Surgeon: Daneil Dolin, MD;  Location: [facility];  Service: Endoscopy;  Laterality: N/A;  . Ercp N/A 10/12/2013    Procedure: ENDOSCOPIC RETROGRADE  CHOLANGIOPANCREATOGRAPHY (ERCP);  Surgeon: Daneil Dolin, MD;  Location: [facility];  Service: Endoscopy;  Laterality: N/A;  . Biliary stent placement N/A 10/12/2013    Procedure: BILIARY STENT REPLACEMENT;  Surgeon: Daneil Dolin, MD;  Location: [facility];  Service: Endoscopy;  Laterality: N/A;  . Lung biopsy  08/31/13    FAMILY HISTORY: family history includes Heart attack in his father; Pancreatic cancer in his mother. There is no history of Colon cancer.  SOCIAL HISTORY:  reports that he quit smoking about 25 years ago. His smoking use included Cigarettes. He has a 20 pack-year smoking history. He has never used smokeless tobacco. He reports that he does not drink alcohol or use illicit drugs.  REVIEW OF SYSTEMS:  Other than that discussed above is noncontributory.  PHYSICAL EXAMINATION: ECOG PERFORMANCE STATUS: 1 - Symptomatic but completely ambulatory Temperature 97.4, pulse 101, respiratory rate 20, blood pressure 137/66. There were no vitals taken for this visit.  GENERAL:alert, no distress and comfortable. SKIN: skin color, texture, turgor are normal, no rashes or significant lesions. Acneform rash on the scalp together with hair loss EYES: PERLA; Conjunctiva are pink and non-injected, sclera clear SINUSES: No redness or tenderness over maxillary or ethmoid sinuses OROPHARYNX:no exudate, no erythema on lips, buccal mucosa, or tongue. NECK: supple, thyroid normal size, non-tender, without nodularity. No masses CHEST: Normal AP diameter with no breast masses. LYMPH:  no palpable lymphadenopathy in the cervical, axillary or inguinal LUNGS: clear to auscultation and percussion with normal breathing effort HEART: regular rate & rhythm and no murmurs. ABDOMEN:abdomen soft, non-tender and normal bowel sounds. Surgical well-healed. Positive fluid wave or shifting dullness. Right inguinal hernia, easily reducible in the recumbent position. MUSCULOSKELETAL:no cyanosis of digits and no clubbing.  Range of motion normal.  NEURO: alert & oriented x 3 with fluent speech, no focal motor/sensory deficits. No asterixis   LABORATORY DATA: Infusion on 12/14/2013  Component Date Value Ref Range Status  . WBC 12/14/2013 2.4* 4.0 - 10.5 K/uL Final  . RBC 12/14/2013 4.01* 4.22 - 5.81 MIL/uL Final  . Hemoglobin 12/14/2013 12.8* 13.0 - 17.0 g/dL Final  . HCT 12/14/2013 37.8* 39.0 - 52.0 % Final  . MCV 12/14/2013 94.3  78.0 - 100.0 fL Final  . MCH 12/14/2013 31.9  26.0 - 34.0 pg Final  . MCHC 12/14/2013 33.9  30.0 - 36.0 g/dL Final  . RDW 12/14/2013 14.5  11.5 - 15.5 % Final  . Platelets 12/14/2013 139* 150 - 400 K/uL Final  . Neutrophils Relative % 12/14/2013 65  43 - 77 % Final  . Neutro Abs 12/14/2013 1.5* 1.7 - 7.7 K/uL Final  . Lymphocytes Relative 12/14/2013 20  12 - 46 % Final  . Lymphs Abs 12/14/2013 0.5* 0.7 - 4.0 K/uL Final  . Monocytes Relative 12/14/2013 14* 3 - 12 % Final  . Monocytes Absolute 12/14/2013 0.3  0.1 - 1.0 K/uL Final  . Eosinophils Relative 12/14/2013 1  0 - 5 % Final  . Eosinophils Absolute 12/14/2013 0.0  0.0 - 0.7 K/uL Final  . Basophils Relative 12/14/2013 0  0 - 1 % Final  . Basophils Absolute 12/14/2013 0.0  0.0 - 0.1 K/uL Final  . Retic Ct Pct 12/14/2013 1.5  0.4 - 3.1 % Final  . RBC. 12/14/2013 4.01* 4.22 - 5.81 MIL/uL Final  .  Retic Count, Manual 12/14/2013 60.2  19.0 - 186.0 K/uL Final  Lab on 12/07/2013  Component Date Value Ref Range Status  . WBC 12/07/2013 0.8* 4.0 - 10.5 K/uL Final   Comment: EPITHELIAL CASTS                          WHITE COUNT CONFIRMED ON SMEAR                          CRITICAL RESULT CALLED TO, READ BACK BY AND VERIFIED WITH:                          SUE BURGESS RN ON [id number] AT 1030 BY RESSEGGER R  . RBC 12/07/2013 3.79* 4.22 - 5.81 MIL/uL Final  . Hemoglobin 12/07/2013 11.8* 13.0 - 17.0 g/dL Final  . HCT 12/07/2013 35.4* 39.0 - 52.0 % Final  . MCV 12/07/2013 93.4  78.0 - 100.0 fL Final  . MCH 12/07/2013 31.1  26.0 - 34.0  pg Final  . MCHC 12/07/2013 33.3  30.0 - 36.0 g/dL Final  . RDW 12/07/2013 13.8  11.5 - 15.5 % Final  . Platelets 12/07/2013 57* 150 - 400 K/uL Final  . Neutrophils Relative % 12/07/2013 21* 43 - 77 % Final  . Neutro Abs 12/07/2013 0.2* 1.7 - 7.7 K/uL Final  . Lymphocytes Relative 12/07/2013 57* 12 - 46 % Final  . Lymphs Abs 12/07/2013 0.4* 0.7 - 4.0 K/uL Final  . Monocytes Relative 12/07/2013 20* 3 - 12 % Final  . Monocytes Absolute 12/07/2013 0.2  0.1 - 1.0 K/uL Final  . Eosinophils Relative 12/07/2013 3  0 - 5 % Final  . Eosinophils Absolute 12/07/2013 0.0  0.0 - 0.7 K/uL Final  . Basophils Relative 12/07/2013 0  0 - 1 % Final  . Basophils Absolute 12/07/2013 0.0  0.0 - 0.1 K/uL Final  . Sodium 12/07/2013 138  137 - 147 mEq/L Final  . Potassium 12/07/2013 4.2  3.7 - 5.3 mEq/L Final  . Chloride 12/07/2013 102  96 - 112 mEq/L Final  . CO2 12/07/2013 25  19 - 32 mEq/L Final  . Glucose, Bld 12/07/2013 236* 70 - 99 mg/dL Final  . BUN 12/07/2013 18  6 - 23 mg/dL Final  . Creatinine, Ser 12/07/2013 0.90  0.50 - 1.35 mg/dL Final  . Calcium 12/07/2013 9.1  8.4 - 10.5 mg/dL Final  . Total Protein 12/07/2013 6.2  6.0 - 8.3 g/dL Final  . Albumin 12/07/2013 2.8* 3.5 - 5.2 g/dL Final  . AST 12/07/2013 56* 0 - 37 U/L Final  . ALT 12/07/2013 72* 0 - 53 U/L Final  . Alkaline Phosphatase 12/07/2013 333* 39 - 117 U/L Final  . Total Bilirubin 12/07/2013 1.3* 0.3 - 1.2 mg/dL Final  . GFR calc non Af Amer 12/07/2013 85* >90 mL/min Final  . GFR calc Af Amer 12/07/2013 >90  >90 mL/min Final   Comment: (NOTE)                          The eGFR has been calculated using the CKD EPI equation.                          This calculation has not been validated in all clinical situations.  eGFR's persistently <90 mL/min signify possible Chronic Kidney                          Disease.  . Anion gap 12/07/2013 11  5 - 15 Final  Infusion on 11/30/2013  Component Date Value Ref Range  Status  . WBC 11/30/2013 0.6* 4.0 - 10.5 K/uL Final   Comment: RESULT REPEATED AND VERIFIED                          WHITE COUNT CONFIRMED ON SMEAR                          CRITICAL RESULT CALLED TO, READ BACK BY AND VERIFIED WITH:                          Caren Macadam ON [id number] AT 0935 BY RESSEGGER R  . RBC 11/30/2013 3.86* 4.22 - 5.81 MIL/uL Final  . Hemoglobin 11/30/2013 12.3* 13.0 - 17.0 g/dL Final  . HCT 11/30/2013 35.6* 39.0 - 52.0 % Final  . MCV 11/30/2013 92.2  78.0 - 100.0 fL Final  . MCH 11/30/2013 31.9  26.0 - 34.0 pg Final  . MCHC 11/30/2013 34.6  30.0 - 36.0 g/dL Final  . RDW 11/30/2013 13.6  11.5 - 15.5 % Final  . Platelets 11/30/2013 47* 150 - 400 K/uL Final   Comment: RESULT REPEATED AND VERIFIED                          PLATELET COUNT CONFIRMED BY SMEAR                          SPECIMEN CHECKED FOR CLOTS  . Neutrophils Relative % 11/30/2013 40* 43 - 77 % Final  . Neutro Abs 11/30/2013 0.3* 1.7 - 7.7 K/uL Final  . Lymphocytes Relative 11/30/2013 56* 12 - 46 % Final  . Lymphs Abs 11/30/2013 0.4* 0.7 - 4.0 K/uL Final  . Monocytes Relative 11/30/2013 2* 3 - 12 % Final  . Monocytes Absolute 11/30/2013 0.0* 0.1 - 1.0 K/uL Final  . Eosinophils Relative 11/30/2013 2  0 - 5 % Final  . Eosinophils Absolute 11/30/2013 0.0  0.0 - 0.7 K/uL Final  . Basophils Relative 11/30/2013 2* 0 - 1 % Final  . Basophils Absolute 11/30/2013 0.0  0.0 - 0.1 K/uL Final  . Sodium 11/30/2013 133* 137 - 147 mEq/L Final  . Potassium 11/30/2013 3.9  3.7 - 5.3 mEq/L Final  . Chloride 11/30/2013 99  96 - 112 mEq/L Final  . CO2 11/30/2013 23  19 - 32 mEq/L Final  . Glucose, Bld 11/30/2013 247* 70 - 99 mg/dL Final  . BUN 11/30/2013 18  6 - 23 mg/dL Final  . Creatinine, Ser 11/30/2013 0.72  0.50 - 1.35 mg/dL Final  . Calcium 11/30/2013 9.1  8.4 - 10.5 mg/dL Final  . Total Protein 11/30/2013 6.4  6.0 - 8.3 g/dL Final  . Albumin 11/30/2013 2.6* 3.5 - 5.2 g/dL Final  . AST 11/30/2013 109* 0 - 37 U/L  Final  . ALT 11/30/2013 123* 0 - 53 U/L Final  . Alkaline Phosphatase 11/30/2013 381* 39 - 117 U/L Final  . Total Bilirubin 11/30/2013 2.7* 0.3 - 1.2 mg/dL Final  . GFR calc non Af Amer 11/30/2013 >90  >  90 mL/min Final  . GFR calc Af Amer 11/30/2013 >90  >90 mL/min Final   Comment: (NOTE)                          The eGFR has been calculated using the CKD EPI equation.                          This calculation has not been validated in all clinical situations.                          eGFR's persistently <90 mL/min signify possible Chronic Kidney                          Disease.  . Anion gap 11/30/2013 11  5 - 15 Final  Office Visit on 11/16/2013  Component Date Value Ref Range Status  . WBC 11/16/2013 3.2* 4.0 - 10.5 K/uL Final  . RBC 11/16/2013 4.75  4.22 - 5.81 MIL/uL Final  . Hemoglobin 11/16/2013 14.9  13.0 - 17.0 g/dL Final  . HCT 11/16/2013 44.0  39.0 - 52.0 % Final  . MCV 11/16/2013 92.6  78.0 - 100.0 fL Final  . MCH 11/16/2013 31.4  26.0 - 34.0 pg Final  . MCHC 11/16/2013 33.9  30.0 - 36.0 g/dL Final  . RDW 11/16/2013 15.5  11.5 - 15.5 % Final  . Platelets 11/16/2013 PLATELET CLUMPS NOTED ON SMEAR, COUNT APPEARS ADEQUATE  150 - 400 K/uL Final  . Neutrophils Relative % 11/16/2013 75  43 - 77 % Final  . Neutro Abs 11/16/2013 2.4  1.7 - 7.7 K/uL Final  . Lymphocytes Relative 11/16/2013 17  12 - 46 % Final  . Lymphs Abs 11/16/2013 0.5* 0.7 - 4.0 K/uL Final  . Monocytes Relative 11/16/2013 7  3 - 12 % Final  . Monocytes Absolute 11/16/2013 0.2  0.1 - 1.0 K/uL Final  . Eosinophils Relative 11/16/2013 2  0 - 5 % Final  . Eosinophils Absolute 11/16/2013 0.1  0.0 - 0.7 K/uL Final  . Basophils Relative 11/16/2013 0  0 - 1 % Final  . Basophils Absolute 11/16/2013 0.0  0.0 - 0.1 K/uL Final  . Sodium 11/16/2013 140  137 - 147 mEq/L Final  . Potassium 11/16/2013 4.0  3.7 - 5.3 mEq/L Final  . Chloride 11/16/2013 105  96 - 112 mEq/L Final  . CO2 11/16/2013 25  19 - 32 mEq/L Final  .  Glucose, Bld 11/16/2013 183* 70 - 99 mg/dL Final  . BUN 11/16/2013 15  6 - 23 mg/dL Final  . Creatinine, Ser 11/16/2013 0.78  0.50 - 1.35 mg/dL Final  . Calcium 11/16/2013 9.0  8.4 - 10.5 mg/dL Final  . Total Protein 11/16/2013 6.7  6.0 - 8.3 g/dL Final  . Albumin 11/16/2013 2.8* 3.5 - 5.2 g/dL Final  . AST 11/16/2013 51* 0 - 37 U/L Final  . ALT 11/16/2013 41  0 - 53 U/L Final  . Alkaline Phosphatase 11/16/2013 343* 39 - 117 U/L Final  . Total Bilirubin 11/16/2013 1.3* 0.3 - 1.2 mg/dL Final  . GFR calc non Af Amer 11/16/2013 >90  >90 mL/min Final  . GFR calc Af Amer 11/16/2013 >90  >90 mL/min Final   Comment: (NOTE)  The eGFR has been calculated using the CKD EPI equation.                          This calculation has not been validated in all clinical situations.                          eGFR's persistently <90 mL/min signify possible Chronic Kidney                          Disease.  . Anion gap 11/16/2013 10  5 - 15 Final  . LDH 11/16/2013 171  94 - 250 U/L Final   SLIGHT HEMOLYSIS  . Ammonia 11/16/2013 27  11 - 60 umol/L Final    PATHOLOGY: No new pathology.  Urinalysis No results found for this basename: colorurine,  appearanceur,  labspec,  phurine,  glucoseu,  hgbur,  bilirubinur,  ketonesur,  proteinur,  urobilinogen,  nitrite,  leukocytesur    RADIOGRAPHIC STUDIES: No results found.  ASSESSMENT:  #1. 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  #2. 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  #3. Nausea secondary to gastroparesis, improved on Reglan per #4. Diabetes mellitus, type II, non-insulin requiring, not controlled.  #5. Hypertension, controlled.  #6. Persistent neutropenia  and thrombocytopenia from chemotherapy. #7. Right inguinal hernia.     PLAN:  #1. Discontinue Afatinib for now. #2. Gemzar/cisplatin IV today. #3. Continue metformin 500 mg twice a day. #4. Prescription for hernia belt given. #5. Followup one week with CBC and for day 8 of cycle 2 of chemotherapy.   All questions were answered. The patient knows to call the clinic with any problems, questions or concerns. We can certainly see the patient much sooner if necessary.   I spent 30 minutes counseling the patient face to face. The total time spent in the appointment was 40 minutes.    Doroteo Bradford, MD 12/14/2013 9:38 AM  DISCLAIMER:  This note was dictated with voice recognition software.  Similar sounding words can inadvertently be transcribed inaccurately and may not be corrected upon review.

## 2013-12-14 NOTE — Patient Instructions (Signed)
[facility] Discharge Instructions for Patients Receiving Chemotherapy  Today you received the following chemotherapy agents Cisplatin and Gemzar. HOLD GILOTRIF (afatinib dimaleate) x 1 week. This may be causing the rash and the diarrhea. We will re-evaluate you next week when you are here for chemotherapy. 3 Sohan Potvin prescriptions were sent to your pharmacy. Please pick them up and begin taking them as directed. You were given a prescription for a hernia belt, you should be able to get that at [facility]. Report any issues/concerns as needed prior to appointments. To help prevent nausea and vomiting after your treatment, we encourage you to take your nausea medication as instructed.  If you develop nausea and vomiting that is not controlled by your nausea medication, call the clinic. If it is after clinic hours your family physician or the after hours number for the clinic or go to the Emergency Department.  BELOW ARE SYMPTOMS THAT SHOULD BE REPORTED IMMEDIATELY:  *FEVER GREATER THAN 101.0 F  *CHILLS WITH OR WITHOUT FEVER  NAUSEA AND VOMITING THAT IS NOT CONTROLLED WITH YOUR NAUSEA MEDICATION  *UNUSUAL SHORTNESS OF BREATH  *UNUSUAL BRUISING OR BLEEDING  TENDERNESS IN MOUTH AND THROAT WITH OR WITHOUT PRESENCE OF ULCERS  *URINARY PROBLEMS  *BOWEL PROBLEMS  UNUSUAL RASH Items with * indicate a potential emergency and should be followed up as soon as possible.  One of the nurses will contact you 24 hours after your treatment. Please let the nurse know about any problems that you may have experienced. Feel free to call the clinic you have any questions or concerns. The clinic phone number is [phone].   I have been informed and understand all the instructions given to me. I know to contact the clinic, my physician, or go to the Emergency Department if any problems should occur. I do not have any questions at this time, but understand that I may call the clinic  during office hours or the Patient Navigator at [phone] should I have any questions or need assistance in obtaining follow up care.    __________________________________________  _____________  __________ Signature of Patient or Authorized Representative            Date                   Time    __________________________________________ Nurse's Signature

## 2013-12-19 ENCOUNTER — Encounter (HOSPITAL_BASED_OUTPATIENT_CLINIC_OR_DEPARTMENT_OTHER): Payer: 59

## 2013-12-19 ENCOUNTER — Other Ambulatory Visit (HOSPITAL_COMMUNITY): Payer: Self-pay | Admitting: Hematology and Oncology

## 2013-12-19 ENCOUNTER — Encounter (HOSPITAL_COMMUNITY): Payer: 59 | Attending: Hematology and Oncology

## 2013-12-19 VITALS — BP 120/90 | HR 98 | Temp 97.9°F | Resp 18

## 2013-12-19 DIAGNOSIS — C3410 Malignant neoplasm of upper lobe, unspecified bronchus or lung: Secondary | ICD-10-CM | POA: Diagnosis present

## 2013-12-19 DIAGNOSIS — R21 Rash and other nonspecific skin eruption: Secondary | ICD-10-CM

## 2013-12-19 DIAGNOSIS — R197 Diarrhea, unspecified: Secondary | ICD-10-CM

## 2013-12-19 DIAGNOSIS — N189 Chronic kidney disease, unspecified: Secondary | ICD-10-CM

## 2013-12-19 DIAGNOSIS — C3411 Malignant neoplasm of upper lobe, right bronchus or lung: Secondary | ICD-10-CM

## 2013-12-19 DIAGNOSIS — D649 Anemia, unspecified: Secondary | ICD-10-CM

## 2013-12-19 DIAGNOSIS — C50919 Malignant neoplasm of unspecified site of unspecified female breast: Secondary | ICD-10-CM

## 2013-12-19 DIAGNOSIS — C779 Secondary and unspecified malignant neoplasm of lymph node, unspecified: Secondary | ICD-10-CM

## 2013-12-19 DIAGNOSIS — C221 Intrahepatic bile duct carcinoma: Secondary | ICD-10-CM | POA: Diagnosis present

## 2013-12-19 DIAGNOSIS — D696 Thrombocytopenia, unspecified: Secondary | ICD-10-CM

## 2013-12-19 DIAGNOSIS — C3490 Malignant neoplasm of unspecified part of unspecified bronchus or lung: Secondary | ICD-10-CM

## 2013-12-19 LAB — CBC WITH DIFFERENTIAL/PLATELET
Basophils Absolute: 0 10*3/uL (ref 0.0–0.1)
Basophils Relative: 1 % (ref 0–1)
EOS PCT: 1 % (ref 0–5)
Eosinophils Absolute: 0 10*3/uL (ref 0.0–0.7)
HEMATOCRIT: 34.7 % — AB (ref 39.0–52.0)
Hemoglobin: 11.8 g/dL — ABNORMAL LOW (ref 13.0–17.0)
LYMPHS PCT: 20 % (ref 12–46)
Lymphs Abs: 0.3 10*3/uL — ABNORMAL LOW (ref 0.7–4.0)
MCH: 32 pg (ref 26.0–34.0)
MCHC: 34 g/dL (ref 30.0–36.0)
MCV: 94 fL (ref 78.0–100.0)
MONO ABS: 0 10*3/uL — AB (ref 0.1–1.0)
Monocytes Relative: 1 % — ABNORMAL LOW (ref 3–12)
Neutro Abs: 1.3 10*3/uL — ABNORMAL LOW (ref 1.7–7.7)
Neutrophils Relative %: 78 % — ABNORMAL HIGH (ref 43–77)
Platelets: 77 10*3/uL — ABNORMAL LOW (ref 150–400)
RBC: 3.69 MIL/uL — ABNORMAL LOW (ref 4.22–5.81)
RDW: 14 % (ref 11.5–15.5)
WBC: 1.7 10*3/uL — ABNORMAL LOW (ref 4.0–10.5)

## 2013-12-19 LAB — BASIC METABOLIC PANEL
Anion gap: 10 (ref 5–15)
BUN: 26 mg/dL — AB (ref 6–23)
CALCIUM: 9.2 mg/dL (ref 8.4–10.5)
CO2: 26 meq/L (ref 19–32)
CREATININE: 0.84 mg/dL (ref 0.50–1.35)
Chloride: 102 mEq/L (ref 96–112)
GFR calc Af Amer: >90 mL/min (ref >90)
GFR calc non Af Amer: 88 mL/min — ABNORMAL LOW (ref >90)
GLUCOSE: 187 mg/dL — AB (ref 70–99)
Potassium: 3.8 mEq/L (ref 3.7–5.3)
Sodium: 138 mEq/L (ref 137–147)

## 2013-12-19 MED ORDER — DIPHENOXYLATE-ATROPINE 2.5-0.025 MG PO TABS: ORAL_TABLET | ORAL | Status: AC

## 2013-12-19 MED ORDER — POTASSIUM CHLORIDE 2 MEQ/ML IV SOLN
INTRAVENOUS | Status: DC
  Filled 2013-12-19 (×8): qty 1000

## 2013-12-19 MED ORDER — KCL IN DEXTROSE-NACL 20-5-0.45 MEQ/L-%-% IV SOLN
INTRAVENOUS | Status: DC
  Administered 2013-12-19: 10:00:00 via INTRAVENOUS
  Filled 2013-12-19 (×8): qty 1000

## 2013-12-19 MED ORDER — OXYCODONE HCL 5 MG PO TABS: ORAL_TABLET | ORAL | Status: DC

## 2013-12-19 MED ORDER — DIPHENOXYLATE-ATROPINE 2.5-0.025 MG PO TABS
3.0000 | ORAL_TABLET | Freq: Once | ORAL | Status: AC
  Administered 2013-12-19: 3 via ORAL
  Filled 2013-12-19: qty 3

## 2013-12-19 NOTE — Progress Notes (Signed)
Bryce Daugherty Tolerated fluids without incident today. Discharged via wheelchair with wife

## 2013-12-19 NOTE — Progress Notes (Signed)
Walk - in with complaints with diarrhea x last 26 hours with at least 12 small stools in last 24 hours and lack of appetite.  Vital signs BP 120/90 P98 R18 T 97.9.  Discussed with Dr. Barnet Glasgow and will draw labs and give IV fluids today.

## 2013-12-19 NOTE — Progress Notes (Signed)
[name]  OFFICE PROGRESS NOTE  Bryce Bogus, MD [place] [address]  DIAGNOSIS: No diagnosis found.  Chief Complaint  Patient presents with  . 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 Afatinib was stopped 5 days ago because of diarrhea and skin rash involving the scalp.  INTERVAL HISTORY: Bryce Daugherty 68 y.o. male returns for evaluation after approximately 48 hour period of loose bowel movements, mix, without melena, hematochezia, or incontinence. Right upper quadrant abdominal pain. Wearing a belt 2 secure right inguinal hernia. Denies any vomiting but with occasional nausea. No peripheral paresthesias, urinary hesitancy, worsening lower extremity swelling or redness, addition of scalp skin rash.   MEDICAL HISTORY: Past Medical History  Diagnosis Date  . Hypertension   . Chronic kidney disease   . Arthritis     lower back, hips knees  . Kidney stones     bilateral stones pain since 04/11/11  . Diabetes mellitus     treatment for 2 years  . 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 Adenocarcinoma, lung     s/p radiation treatments    INTERIM HISTORY: has Ureteral stone; Dilation of biliary tract; Jaundice; Loss of weight; Obstructive jaundice; Type II or unspecified type diabetes mellitus without mention of complication, not stated as uncontrolled; Unspecified essential hypertension; Lung cancer, upper lobe; Intrahepatic cholangiocarcinoma; and Ascites on his problem list.    ALLERGIES:  has No Known Allergies.  MEDICATIONS: has a current medication list which includes the following prescription(s): acetaminophen, aspirin ec, atenolol, diphenoxylate-atropine, doxazosin, glipizide, metformin, metoclopramide, omeprazole, prochlorperazine, afatinib dimaleate, ciprofloxacin, and oxycodone, and the following Facility-Administered Medications:  dextrose 5 % and 0.45 % nacl with kcl 20 meq/l.  SURGICAL HISTORY:  Past Surgical History  Procedure Laterality Date  . Cystectomy  under left arm  . Colonoscopy  03/24/2011    [id number] hemorrhoids  . Cholecystectomy      1984  . Catarac both eyes    . Tonsillectomy    . Ercp N/A 08/03/2013    Dr. Gala Romney: with sphincterotomy, balloon dilation, stone extraction, plastic stent placement. Tbili 26 pre-operatively  . Sphincterotomy N/A 08/03/2013    Procedure: SPHINCTEROTOMY;  Surgeon: Daneil Dolin, MD;  Location: [facility];  Service: Endoscopy;  Laterality: N/A;  with dilation   . Biliary stent placement N/A 08/03/2013    Procedure: BILIARY STENT PLACEMENT;  Surgeon: Daneil Dolin, MD;  Location: [facility];  Service: Endoscopy;  Laterality: N/A;  . Removal of stones N/A 08/03/2013    Procedure: REMOVAL OF STONES;  Surgeon: Daneil Dolin, MD;  Location: [facility];  Service: Endoscopy;  Laterality: N/A;  . Ercp N/A 10/12/2013    Procedure: ENDOSCOPIC RETROGRADE CHOLANGIOPANCREATOGRAPHY (ERCP);  Surgeon: Daneil Dolin, MD;  Location: [facility];  Service: Endoscopy;  Laterality: N/A;  . Biliary stent placement N/A 10/12/2013    Procedure: BILIARY STENT REPLACEMENT;  Surgeon: Daneil Dolin, MD;  Location: [facility];  Service: Endoscopy;  Laterality: N/A;  . Lung biopsy  08/31/13    FAMILY HISTORY: family history includes Heart attack in his father; Pancreatic cancer in his mother. There is no history of Colon cancer.  SOCIAL HISTORY:  reports that he quit smoking about 25 years ago. His smoking use included Cigarettes. He has a 20 pack-year smoking history. He has never used smokeless tobacco. He reports that he does not drink  alcohol or use illicit drugs.  REVIEW OF SYSTEMS:  Other than that discussed above is noncontributory.  PHYSICAL EXAMINATION: ECOG PERFORMANCE STATUS: 1 - Symptomatic but completely ambulatory  Blood pressure 120/90, pulse 98, temperature 97.9 F (36.6 C), temperature source  Oral, resp. rate 18.  GENERAL:alert, no distress and comfortable SKIN: skin color, texture, turgor are normal, no rashes or significant lesions EYES: PERLA; Conjunctiva are pink and non-injected, sclera clear SINUSES: No redness or tenderness over maxillary or ethmoid sinuses OROPHARYNX:no exudate, no erythema on lips, buccal mucosa, or tongue. NECK: supple, thyroid normal size, non-tender, without nodularity. No masses CHEST: Normal AP diameter with no breast masses. LYMPH:  no palpable lymphadenopathy in the cervical, axillary or inguinal LUNGS: clear to auscultation and percussion with normal breathing effort HEART: regular rate & rhythm and no murmurs. ABDOMEN:abdomen soft, non-tender and normal bowel sounds. Distended with fluid wave and shifting dullness. Writing or hernia easily reducible in the supine position. MUSCULOSKELETAL:no cyanosis of digits and no clubbing. Range of motion normal.  NEURO: alert & oriented x 3 with fluent speech, no focal motor/sensory deficits. No asterixis.   LABORATORY DATA: Infusion on 12/19/2013  Component Date Value Ref Range Status  . WBC 12/19/2013 1.7* 4.0 - 10.5 K/uL Final  . RBC 12/19/2013 3.69* 4.22 - 5.81 MIL/uL Final  . Hemoglobin 12/19/2013 11.8* 13.0 - 17.0 g/dL Final  . HCT 12/19/2013 34.7* 39.0 - 52.0 % Final  . MCV 12/19/2013 94.0  78.0 - 100.0 fL Final  . MCH 12/19/2013 32.0  26.0 - 34.0 pg Final  . MCHC 12/19/2013 34.0  30.0 - 36.0 g/dL Final  . RDW 12/19/2013 14.0  11.5 - 15.5 % Final  . Platelets 12/19/2013 77* 150 - 400 K/uL Final   Comment: SPECIMEN CHECKED FOR CLOTS                          PLATELET COUNT CONFIRMED BY SMEAR  . Neutrophils Relative % 12/19/2013 78* 43 - 77 % Final  . Neutro Abs 12/19/2013 1.3* 1.7 - 7.7 K/uL Final  . Lymphocytes Relative 12/19/2013 20  12 - 46 % Final  . Lymphs Abs 12/19/2013 0.3* 0.7 - 4.0 K/uL Final  . Monocytes Relative 12/19/2013 1* 3 - 12 % Final  . Monocytes Absolute 12/19/2013 0.0*  0.1 - 1.0 K/uL Final  . Eosinophils Relative 12/19/2013 1  0 - 5 % Final  . Eosinophils Absolute 12/19/2013 0.0  0.0 - 0.7 K/uL Final  . Basophils Relative 12/19/2013 1  0 - 1 % Final  . Basophils Absolute 12/19/2013 0.0  0.0 - 0.1 K/uL Final  . Sodium 12/19/2013 138  137 - 147 mEq/L Final  . Potassium 12/19/2013 3.8  3.7 - 5.3 mEq/L Final  . Chloride 12/19/2013 102  96 - 112 mEq/L Final  . CO2 12/19/2013 26  19 - 32 mEq/L Final  . Glucose, Bld 12/19/2013 187* 70 - 99 mg/dL Final  . BUN 12/19/2013 26* 6 - 23 mg/dL Final  . Creatinine, Ser 12/19/2013 0.84  0.50 - 1.35 mg/dL Final  . Calcium 12/19/2013 9.2  8.4 - 10.5 mg/dL Final  . GFR calc non Af Amer 12/19/2013 88* >90 mL/min Final  . GFR calc Af Amer 12/19/2013 >90  >90 mL/min Final   Comment: (NOTE)                          The eGFR has been calculated using  the CKD EPI equation.                          This calculation has not been validated in all clinical situations.                          eGFR's persistently <90 mL/min signify possible Chronic Kidney                          Disease.  . Anion gap 12/19/2013 10  5 - 15 Final  Infusion on 12/14/2013  Component Date Value Ref Range Status  . WBC 12/14/2013 2.4* 4.0 - 10.5 K/uL Final  . RBC 12/14/2013 4.01* 4.22 - 5.81 MIL/uL Final  . Hemoglobin 12/14/2013 12.8* 13.0 - 17.0 g/dL Final  . HCT 12/14/2013 37.8* 39.0 - 52.0 % Final  . MCV 12/14/2013 94.3  78.0 - 100.0 fL Final  . MCH 12/14/2013 31.9  26.0 - 34.0 pg Final  . MCHC 12/14/2013 33.9  30.0 - 36.0 g/dL Final  . RDW 12/14/2013 14.5  11.5 - 15.5 % Final  . Platelets 12/14/2013 139* 150 - 400 K/uL Final  . Neutrophils Relative % 12/14/2013 65  43 - 77 % Final  . Neutro Abs 12/14/2013 1.5* 1.7 - 7.7 K/uL Final  . Lymphocytes Relative 12/14/2013 20  12 - 46 % Final  . Lymphs Abs 12/14/2013 0.5* 0.7 - 4.0 K/uL Final  . Monocytes Relative 12/14/2013 14* 3 - 12 % Final  . Monocytes Absolute 12/14/2013 0.3  0.1 - 1.0 K/uL  Final  . Eosinophils Relative 12/14/2013 1  0 - 5 % Final  . Eosinophils Absolute 12/14/2013 0.0  0.0 - 0.7 K/uL Final  . Basophils Relative 12/14/2013 0  0 - 1 % Final  . Basophils Absolute 12/14/2013 0.0  0.0 - 0.1 K/uL Final  . Retic Ct Pct 12/14/2013 1.5  0.4 - 3.1 % Final  . RBC. 12/14/2013 4.01* 4.22 - 5.81 MIL/uL Final  . Retic Count, Manual 12/14/2013 60.2  19.0 - 186.0 K/uL Final  . Sodium 12/14/2013 138  137 - 147 mEq/L Final  . Potassium 12/14/2013 3.6* 3.7 - 5.3 mEq/L Final  . Chloride 12/14/2013 103  96 - 112 mEq/L Final  . CO2 12/14/2013 23  19 - 32 mEq/L Final  . Glucose, Bld 12/14/2013 177* 70 - 99 mg/dL Final  . BUN 12/14/2013 18  6 - 23 mg/dL Final  . Creatinine, Ser 12/14/2013 0.82  0.50 - 1.35 mg/dL Final  . Calcium 12/14/2013 8.7  8.4 - 10.5 mg/dL Final  . Total Protein 12/14/2013 6.3  6.0 - 8.3 g/dL Final  . Albumin 12/14/2013 2.7* 3.5 - 5.2 g/dL Final  . AST 12/14/2013 49* 0 - 37 U/L Final  . ALT 12/14/2013 43  0 - 53 U/L Final  . Alkaline Phosphatase 12/14/2013 322* 39 - 117 U/L Final  . Total Bilirubin 12/14/2013 1.1  0.3 - 1.2 mg/dL Final  . GFR calc non Af Amer 12/14/2013 89* >90 mL/min Final  . GFR calc Af Amer 12/14/2013 >90  >90 mL/min Final   Comment: (NOTE)                          The eGFR has been calculated using the CKD EPI equation.  This calculation has not been validated in all clinical situations.                          eGFR's persistently <90 mL/min signify possible Chronic Kidney                          Disease.  . Anion gap 12/14/2013 12  5 - 15 Final  Lab on 12/07/2013  Component Date Value Ref Range Status  . WBC 12/07/2013 0.8* 4.0 - 10.5 K/uL Final   Comment: EPITHELIAL CASTS                          WHITE COUNT CONFIRMED ON SMEAR                          CRITICAL RESULT CALLED TO, READ BACK BY AND VERIFIED WITH:                          SUE BURGESS RN ON [id number] AT 1030 BY RESSEGGER R  . RBC 12/07/2013  3.79* 4.22 - 5.81 MIL/uL Final  . Hemoglobin 12/07/2013 11.8* 13.0 - 17.0 g/dL Final  . HCT 12/07/2013 35.4* 39.0 - 52.0 % Final  . MCV 12/07/2013 93.4  78.0 - 100.0 fL Final  . MCH 12/07/2013 31.1  26.0 - 34.0 pg Final  . MCHC 12/07/2013 33.3  30.0 - 36.0 g/dL Final  . RDW 12/07/2013 13.8  11.5 - 15.5 % Final  . Platelets 12/07/2013 57* 150 - 400 K/uL Final  . Neutrophils Relative % 12/07/2013 21* 43 - 77 % Final  . Neutro Abs 12/07/2013 0.2* 1.7 - 7.7 K/uL Final  . Lymphocytes Relative 12/07/2013 57* 12 - 46 % Final  . Lymphs Abs 12/07/2013 0.4* 0.7 - 4.0 K/uL Final  . Monocytes Relative 12/07/2013 20* 3 - 12 % Final  . Monocytes Absolute 12/07/2013 0.2  0.1 - 1.0 K/uL Final  . Eosinophils Relative 12/07/2013 3  0 - 5 % Final  . Eosinophils Absolute 12/07/2013 0.0  0.0 - 0.7 K/uL Final  . Basophils Relative 12/07/2013 0  0 - 1 % Final  . Basophils Absolute 12/07/2013 0.0  0.0 - 0.1 K/uL Final  . Sodium 12/07/2013 138  137 - 147 mEq/L Final  . Potassium 12/07/2013 4.2  3.7 - 5.3 mEq/L Final  . Chloride 12/07/2013 102  96 - 112 mEq/L Final  . CO2 12/07/2013 25  19 - 32 mEq/L Final  . Glucose, Bld 12/07/2013 236* 70 - 99 mg/dL Final  . BUN 12/07/2013 18  6 - 23 mg/dL Final  . Creatinine, Ser 12/07/2013 0.90  0.50 - 1.35 mg/dL Final  . Calcium 12/07/2013 9.1  8.4 - 10.5 mg/dL Final  . Total Protein 12/07/2013 6.2  6.0 - 8.3 g/dL Final  . Albumin 12/07/2013 2.8* 3.5 - 5.2 g/dL Final  . AST 12/07/2013 56* 0 - 37 U/L Final  . ALT 12/07/2013 72* 0 - 53 U/L Final  . Alkaline Phosphatase 12/07/2013 333* 39 - 117 U/L Final  . Total Bilirubin 12/07/2013 1.3* 0.3 - 1.2 mg/dL Final  . GFR calc non Af Amer 12/07/2013 85* >90 mL/min Final  . GFR calc Af Amer 12/07/2013 >90  >90 mL/min Final   Comment: (NOTE)  The eGFR has been calculated using the CKD EPI equation.                          This calculation has not been validated in all clinical situations.                           eGFR's persistently <90 mL/min signify possible Chronic Kidney                          Disease.  . Anion gap 12/07/2013 11  5 - 15 Final  Infusion on 11/30/2013  Component Date Value Ref Range Status  . WBC 11/30/2013 0.6* 4.0 - 10.5 K/uL Final   Comment: RESULT REPEATED AND VERIFIED                          WHITE COUNT CONFIRMED ON SMEAR                          CRITICAL RESULT CALLED TO, READ BACK BY AND VERIFIED WITH:                          Caren Macadam ON [id number] AT 0935 BY RESSEGGER R  . RBC 11/30/2013 3.86* 4.22 - 5.81 MIL/uL Final  . Hemoglobin 11/30/2013 12.3* 13.0 - 17.0 g/dL Final  . HCT 11/30/2013 35.6* 39.0 - 52.0 % Final  . MCV 11/30/2013 92.2  78.0 - 100.0 fL Final  . MCH 11/30/2013 31.9  26.0 - 34.0 pg Final  . MCHC 11/30/2013 34.6  30.0 - 36.0 g/dL Final  . RDW 11/30/2013 13.6  11.5 - 15.5 % Final  . Platelets 11/30/2013 47* 150 - 400 K/uL Final   Comment: RESULT REPEATED AND VERIFIED                          PLATELET COUNT CONFIRMED BY SMEAR                          SPECIMEN CHECKED FOR CLOTS  . Neutrophils Relative % 11/30/2013 40* 43 - 77 % Final  . Neutro Abs 11/30/2013 0.3* 1.7 - 7.7 K/uL Final  . Lymphocytes Relative 11/30/2013 56* 12 - 46 % Final  . Lymphs Abs 11/30/2013 0.4* 0.7 - 4.0 K/uL Final  . Monocytes Relative 11/30/2013 2* 3 - 12 % Final  . Monocytes Absolute 11/30/2013 0.0* 0.1 - 1.0 K/uL Final  . Eosinophils Relative 11/30/2013 2  0 - 5 % Final  . Eosinophils Absolute 11/30/2013 0.0  0.0 - 0.7 K/uL Final  . Basophils Relative 11/30/2013 2* 0 - 1 % Final  . Basophils Absolute 11/30/2013 0.0  0.0 - 0.1 K/uL Final  . Sodium 11/30/2013 133* 137 - 147 mEq/L Final  . Potassium 11/30/2013 3.9  3.7 - 5.3 mEq/L Final  . Chloride 11/30/2013 99  96 - 112 mEq/L Final  . CO2 11/30/2013 23  19 - 32 mEq/L Final  . Glucose, Bld 11/30/2013 247* 70 - 99 mg/dL Final  . BUN 11/30/2013 18  6 - 23 mg/dL Final  . Creatinine, Ser 11/30/2013 0.72  0.50 -  1.35 mg/dL Final  . Calcium 11/30/2013 9.1  8.4 - 10.5 mg/dL Final  . Total Protein  11/30/2013 6.4  6.0 - 8.3 g/dL Final  . Albumin 11/30/2013 2.6* 3.5 - 5.2 g/dL Final  . AST 11/30/2013 109* 0 - 37 U/L Final  . ALT 11/30/2013 123* 0 - 53 U/L Final  . Alkaline Phosphatase 11/30/2013 381* 39 - 117 U/L Final  . Total Bilirubin 11/30/2013 2.7* 0.3 - 1.2 mg/dL Final  . GFR calc non Af Amer 11/30/2013 >90  >90 mL/min Final  . GFR calc Af Amer 11/30/2013 >90  >90 mL/min Final   Comment: (NOTE)                          The eGFR has been calculated using the CKD EPI equation.                          This calculation has not been validated in all clinical situations.                          eGFR's persistently <90 mL/min signify possible Chronic Kidney                          Disease.  . Anion gap 11/30/2013 11  5 - 15 Final    PATHOLOGY: No new pathology.  Urinalysis No results found for this basename: colorurine,  appearanceur,  labspec,  phurine,  glucoseu,  hgbur,  bilirubinur,  ketonesur,  proteinur,  urobilinogen,  nitrite,  leukocytesur    RADIOGRAPHIC STUDIES: No results found.  ASSESSMENT:  #1. 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  #2. 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 #3. Nausea secondary to gastroparesis, improved on Reglan. #4. Diabetes mellitus, type II, non-insulin requiring, not controlled.  #5. Hypertension, controlled.  #6. Neutropenia and thrombocytopenia from chemotherapy.  #7. Right inguinal hernia. #8. Persistent diarrhea with decreased fluid intake.    PLAN:  #1. Lomotil 3 tablets. #2. Lomotil take 2-4 tablets  every 2-4 hours as needed for diarrhea. #3. Oxycodone 5-10 mg every 2-4 hours as needed for pain. #4. Continue to wear hernia belt during the day. #5. IV D5 half-normal saline +20 mEq of KCl 2050 cc per hour for 4 hours. #6. Followup on 12/21/2013 representing day 8 of cycle 2 of chemotherapy.   All questions were answered. The patient knows to call the clinic with any problems, questions or concerns. We can certainly see the patient much sooner if necessary.   I spent 25 minutes counseling the patient face to face. The total time spent in the appointment was 30 minutes.    Doroteo Bradford, MD 12/19/2013 10:43 AM  DISCLAIMER:  This note was dictated with voice recognition software.  Similar sounding words can inadvertently be transcribed inaccurately and may not be corrected upon review.

## 2013-12-19 NOTE — Patient Instructions (Signed)
[facility] Discharge Instructions  RECOMMENDATIONS MADE BY THE CONSULTANT AND ANY TEST RESULTS WILL BE SENT TO YOUR REFERRING PHYSICIAN.  EXAM FINDINGS BY THE PHYSICIAN TODAY AND SIGNS OR SYMPTOMS TO REPORT TO CLINIC OR PRIMARY PHYSICIAN: Exam and findings as discussed by Dr. Barnet Glasgow.  Push fluids while you are having diarrhea.   MEDICATIONS PRESCRIBED:  Lomotil - take as directed for diarrhea Oxycodone - take as directed for pain  INSTRUCTIONS/FOLLOW-UP: Keep scheduled appointments.  Thank you for choosing [facility] to provide your oncology and hematology care.  To afford each patient quality time with our providers, please arrive at least 15 minutes before your scheduled appointment time.  With your help, our goal is to use those 15 minutes to complete the necessary work-up to ensure our physicians have the information they need to help with your evaluation and healthcare recommendations.    Effective January 1st, 2014, we ask that you re-schedule your appointment with our physicians should you arrive 10 or more minutes late for your appointment.  We strive to give you quality time with our providers, and arriving late affects you and other patients whose appointments are after yours.    Again, thank you for choosing [facility].  Our hope is that these requests will decrease the amount of time that you wait before being seen by our physicians.       _____________________________________________________________  Should you have questions after your visit to [facility], please contact our office at [phone] between the hours of 8:30 a.m. and 4:30 p.m.  Voicemails left after 4:30 p.m. will not be returned until the following business day.  For prescription refill requests, have your pharmacy contact our office with your prescription refill request.    _______________________________________________________________  We  hope that we have given you very good care.  You may receive a patient satisfaction survey in the mail, please complete it and return it as soon as possible.  We value your feedback!  _______________________________________________________________  Have you asked about our STAR program?  STAR stands for Survivorship Training and Rehabilitation, and this is a nationally recognized cancer care program that focuses on survivorship and rehabilitation.  Cancer and cancer treatments may cause problems, such as, pain, making you feel tired and keeping you from doing the things that you need or want to do. Cancer rehabilitation can help. Our goal is to reduce these troubling effects and help you have the best quality of life possible.  You may receive a survey from a nurse that asks questions about your current state of health.  Based on the survey results, all eligible patients will be referred to the [facility] program for an evaluation so we can better serve you!  A frequently asked questions sheet is available upon request.

## 2013-12-21 ENCOUNTER — Encounter (HOSPITAL_COMMUNITY): Payer: Self-pay

## 2013-12-21 ENCOUNTER — Encounter (HOSPITAL_BASED_OUTPATIENT_CLINIC_OR_DEPARTMENT_OTHER): Payer: 59

## 2013-12-21 VITALS — BP 128/79 | HR 92 | Temp 98.0°F | Resp 16

## 2013-12-21 VITALS — BP 130/76 | HR 95 | Temp 97.9°F | Resp 18

## 2013-12-21 DIAGNOSIS — D6959 Other secondary thrombocytopenia: Secondary | ICD-10-CM

## 2013-12-21 DIAGNOSIS — R188 Other ascites: Secondary | ICD-10-CM

## 2013-12-21 DIAGNOSIS — C3410 Malignant neoplasm of upper lobe, unspecified bronchus or lung: Secondary | ICD-10-CM

## 2013-12-21 DIAGNOSIS — Z5111 Encounter for antineoplastic chemotherapy: Secondary | ICD-10-CM

## 2013-12-21 DIAGNOSIS — E119 Type 2 diabetes mellitus without complications: Secondary | ICD-10-CM

## 2013-12-21 DIAGNOSIS — C7800 Secondary malignant neoplasm of unspecified lung: Secondary | ICD-10-CM

## 2013-12-21 DIAGNOSIS — R197 Diarrhea, unspecified: Secondary | ICD-10-CM

## 2013-12-21 DIAGNOSIS — K3184 Gastroparesis: Secondary | ICD-10-CM

## 2013-12-21 DIAGNOSIS — C3411 Malignant neoplasm of upper lobe, right bronchus or lung: Secondary | ICD-10-CM

## 2013-12-21 DIAGNOSIS — I10 Essential (primary) hypertension: Secondary | ICD-10-CM

## 2013-12-21 DIAGNOSIS — C221 Intrahepatic bile duct carcinoma: Secondary | ICD-10-CM

## 2013-12-21 DIAGNOSIS — R21 Rash and other nonspecific skin eruption: Secondary | ICD-10-CM

## 2013-12-21 DIAGNOSIS — R110 Nausea: Secondary | ICD-10-CM

## 2013-12-21 DIAGNOSIS — D702 Other drug-induced agranulocytosis: Secondary | ICD-10-CM

## 2013-12-21 LAB — COMPREHENSIVE METABOLIC PANEL
ALBUMIN: 2.7 g/dL — AB (ref 3.5–5.2)
ALT: 47 U/L (ref 0–53)
ANION GAP: 12 (ref 5–15)
AST: 48 U/L — ABNORMAL HIGH (ref 0–37)
Alkaline Phosphatase: 254 U/L — ABNORMAL HIGH (ref 39–117)
BILIRUBIN TOTAL: 1.7 mg/dL — AB (ref 0.3–1.2)
BUN: 28 mg/dL — ABNORMAL HIGH (ref 6–23)
CO2: 25 mEq/L (ref 19–32)
CREATININE: 0.94 mg/dL (ref 0.50–1.35)
Calcium: 9.2 mg/dL (ref 8.4–10.5)
Chloride: 100 mEq/L (ref 96–112)
GFR calc Af Amer: >90 mL/min (ref >90)
GFR calc non Af Amer: 84 mL/min — ABNORMAL LOW (ref >90)
GLUCOSE: 149 mg/dL — AB (ref 70–99)
Potassium: 3.8 mEq/L (ref 3.7–5.3)
Sodium: 137 mEq/L (ref 137–147)
TOTAL PROTEIN: 5.9 g/dL — AB (ref 6.0–8.3)

## 2013-12-21 LAB — CBC WITH DIFFERENTIAL/PLATELET
BASOS ABS: 0 10*3/uL (ref 0.0–0.1)
Basophils Relative: 1 % (ref 0–1)
EOS PCT: 0 % (ref 0–5)
Eosinophils Absolute: 0 10*3/uL (ref 0.0–0.7)
HCT: 32 % — ABNORMAL LOW (ref 39.0–52.0)
Hemoglobin: 10.9 g/dL — ABNORMAL LOW (ref 13.0–17.0)
Lymphocytes Relative: 46 % (ref 12–46)
Lymphs Abs: 0.6 10*3/uL — ABNORMAL LOW (ref 0.7–4.0)
MCH: 31.6 pg (ref 26.0–34.0)
MCHC: 34.1 g/dL (ref 30.0–36.0)
MCV: 92.8 fL (ref 78.0–100.0)
Monocytes Absolute: 0.1 10*3/uL (ref 0.1–1.0)
Monocytes Relative: 4 % (ref 3–12)
Neutro Abs: 0.6 10*3/uL — ABNORMAL LOW (ref 1.7–7.7)
Neutrophils Relative %: 50 % (ref 43–77)
Platelets: 50 10*3/uL — ABNORMAL LOW (ref 150–400)
RBC: 3.45 MIL/uL — ABNORMAL LOW (ref 4.22–5.81)
RDW: 13.8 % (ref 11.5–15.5)
Smear Review: DECREASED
WBC: 1.2 10*3/uL — CL (ref 4.0–10.5)

## 2013-12-21 MED ORDER — SODIUM CHLORIDE 0.9 % IJ SOLN
10.0000 mL | INTRAMUSCULAR | Status: DC | PRN
  Administered 2013-12-21: 10 mL

## 2013-12-21 MED ORDER — SODIUM CHLORIDE 0.9 % IV SOLN
600.0000 mg/m2 | Freq: Once | INTRAVENOUS | Status: AC
  Administered 2013-12-21: 1292 mg via INTRAVENOUS
  Filled 2013-12-21: qty 33.98

## 2013-12-21 MED ORDER — PALONOSETRON HCL INJECTION 0.25 MG/5ML
0.2500 mg | Freq: Once | INTRAVENOUS | Status: AC
  Administered 2013-12-21: 0.25 mg via INTRAVENOUS
  Filled 2013-12-21: qty 5

## 2013-12-21 MED ORDER — POTASSIUM CHLORIDE 2 MEQ/ML IV SOLN
Freq: Once | INTRAVENOUS | Status: AC
  Administered 2013-12-21: 10:00:00 via INTRAVENOUS
  Filled 2013-12-21: qty 10

## 2013-12-21 MED ORDER — ONDANSETRON 8 MG PO TBDP: ORAL_TABLET | ORAL | Status: AC

## 2013-12-21 MED ORDER — FOSAPREPITANT DIMEGLUMINE INJECTION 150 MG
Freq: Once | INTRAVENOUS | Status: AC
  Administered 2013-12-21: 09:00:00 via INTRAVENOUS
  Filled 2013-12-21: qty 5

## 2013-12-21 MED ORDER — SODIUM CHLORIDE 0.9 % IV SOLN
25.0000 mg/m2 | Freq: Once | INTRAVENOUS | Status: AC
  Administered 2013-12-21: 54 mg via INTRAVENOUS
  Filled 2013-12-21: qty 54

## 2013-12-21 NOTE — Progress Notes (Signed)
CRITICAL VALUE ALERT  Critical value received:  WBC 1.2  Date of notification:  12/21/13   Time of notification:  0918   Critical value read back:Yes.    Nurse who received alert:  A. Ouida Sills, RN  MD notified (1st page):  Dr. Barnet Glasgow  Time of first page:  [phone]

## 2013-12-21 NOTE — Patient Instructions (Signed)
[facility] Discharge Instructions for Patients Receiving Chemotherapy  Today you received the following chemotherapy agents Cisplatin and Gemzar (dose reduced today). CT scans of Chest, Abdomen, and Pelvis next week. Follow up MD appointment after CT scans. A prescription for Zofran was sent to your pharmacy. You are to still take the Metoclopramide as instructed. The Zofran is for any intermittent nausea/vomiting you may have and you can put this under your tongue and let is dissolve instead of swallowing it. Report any issues/concerns to clinic as needed. We are closed on weekends and next Monday for Labor Day. To help prevent nausea and vomiting after your treatment, we encourage you to take your nausea medication as instructed.   If you develop nausea and vomiting that is not controlled by your nausea medication, call the clinic. If it is after clinic hours your family physician or the after hours number for the clinic or go to the Emergency Department.   BELOW ARE SYMPTOMS THAT SHOULD BE REPORTED IMMEDIATELY:  *FEVER GREATER THAN 101.0 F  *CHILLS WITH OR WITHOUT FEVER  NAUSEA AND VOMITING THAT IS NOT CONTROLLED WITH YOUR NAUSEA MEDICATION  *UNUSUAL SHORTNESS OF BREATH  *UNUSUAL BRUISING OR BLEEDING  TENDERNESS IN MOUTH AND THROAT WITH OR WITHOUT PRESENCE OF ULCERS  *URINARY PROBLEMS  *BOWEL PROBLEMS  UNUSUAL RASH Items with * indicate a potential emergency and should be followed up as soon as possible.   I have been informed and understand all the instructions given to me. I know to contact the clinic, my physician, or go to the Emergency Department if any problems should occur. I do not have any questions at this time, but understand that I may call the clinic during office hours or the Patient Navigator at [phone] should I have any questions or need assistance in obtaining follow up care.    __________________________________________  _____________   __________ Signature of Patient or Authorized Representative            Date                   Time    __________________________________________ Nurse's Signature

## 2013-12-21 NOTE — Progress Notes (Signed)
[facility]  OFFICE PROGRESS NOTE  Bryce Bogus, MD [address] [name] [address]  DIAGNOSIS: Intrahepatic cholangiocarcinoma - Plan: CT Abdomen Pelvis W Contrast, CT Chest W Contrast  Malignant neoplasm of upper lobe of right lung - Plan: CT Abdomen Pelvis W Contrast, CT Chest W Contrast  No chief complaint on file.   CURRENT THERAPY: Cis-platinum/Gemzar day 1 and 8 every 21 days for cholangiocarcinoma. Afatinib was stopped 10 days ago because of diarrhea and skin rash involving the scalp. Plantar C. day 8 of treatment today of cycle 2.   INTERVAL HISTORY: Bryce Daugherty 68 y.o. male returns for followup and continuation of therapy for progressive cholangiocarcinoma, intrahepatic, concomitantly present with EGFR mutation and adenocarcinoma lung with lung metastases. He has not required any Lomotil since last night. Scalp itchiness has dissipated along with the rash. He said inability to keep metoclopramide down when he feels nauseated for the last 2 doses. Lower extremity swelling and abdominal swelling are not worse. He denies any fever, sore throat, headache, cough, wheezing, or entrapment of his inguinal hernia.   MEDICAL HISTORY: Past Medical History  Diagnosis Date  . Hypertension   . Chronic kidney disease   . Arthritis     lower back, hips knees  . Kidney stones     bilateral stones pain since 04/11/11  . Diabetes mellitus     treatment for 2 years  . 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 Adenocarcinoma, lung     s/p radiation treatments    INTERIM HISTORY: has Ureteral stone; Dilation of biliary tract; Jaundice; Loss of weight; Obstructive jaundice; Type II or unspecified type diabetes mellitus without mention of complication, not stated as uncontrolled; Unspecified essential hypertension; Lung cancer, upper lobe; Intrahepatic cholangiocarcinoma; and Ascites on his problem list.    ALLERGIES:  has No  Known Allergies.  MEDICATIONS: has a current medication list which includes the following prescription(s): acetaminophen, afatinib dimaleate, aspirin ec, atenolol, ciprofloxacin, diphenoxylate-atropine, doxazosin, glipizide, metformin, metoclopramide, omeprazole, oxycodone, prochlorperazine, and ondansetron, and the following Facility-Administered Medications: sodium chloride.  SURGICAL HISTORY:  Past Surgical History  Procedure Laterality Date  . Cystectomy  under left arm  . Colonoscopy  03/24/2011    [id number] hemorrhoids  . Cholecystectomy      1984  . Catarac both eyes    . Tonsillectomy    . Ercp N/A 08/03/2013    Dr. Gala Romney: with sphincterotomy, balloon dilation, stone extraction, plastic stent placement. Tbili 26 pre-operatively  . Sphincterotomy N/A 08/03/2013    Procedure: SPHINCTEROTOMY;  Surgeon: Daneil Dolin, MD;  Location: [facility];  Service: Endoscopy;  Laterality: N/A;  with dilation   . Biliary stent placement N/A 08/03/2013    Procedure: BILIARY STENT PLACEMENT;  Surgeon: Daneil Dolin, MD;  Location: [facility];  Service: Endoscopy;  Laterality: N/A;  . Removal of stones N/A 08/03/2013    Procedure: REMOVAL OF STONES;  Surgeon: Daneil Dolin, MD;  Location: [facility];  Service: Endoscopy;  Laterality: N/A;  . Ercp N/A 10/12/2013    Procedure: ENDOSCOPIC RETROGRADE CHOLANGIOPANCREATOGRAPHY (ERCP);  Surgeon: Daneil Dolin, MD;  Location: [facility];  Service: Endoscopy;  Laterality: N/A;  . Biliary stent placement N/A 10/12/2013    Procedure: BILIARY STENT REPLACEMENT;  Surgeon: Daneil Dolin, MD;  Location: [facility];  Service: Endoscopy;  Laterality: N/A;  . Lung biopsy  08/31/13    FAMILY HISTORY: family history includes Heart attack  in his father; Pancreatic cancer in his mother. There is no history of Colon cancer.  SOCIAL HISTORY:  reports that he quit smoking about 25 years ago. His smoking use included Cigarettes. He has a 20 pack-year smoking history. He has never used  smokeless tobacco. He reports that he does not drink alcohol or use illicit drugs.  REVIEW OF SYSTEMS:  Other than that discussed above is noncontributory.  PHYSICAL EXAMINATION: ECOG PERFORMANCE STATUS: 1 - Symptomatic but completely ambulatory  Blood pressure 128/79, pulse 92, temperature 98 F (36.7 C), temperature source Oral, resp. rate 16.  GENERAL:alert, no distress and comfortable SKIN: skin color, texture, turgor are normal, no rashes or significant lesions EYES: PERLA; Conjunctiva are pink and non-injected, sclera clear SINUSES: No redness or tenderness over maxillary or ethmoid sinuses OROPHARYNX:no exudate, no erythema on lips, buccal mucosa, or tongue. NECK: supple, thyroid normal size, non-tender, without nodularity. No masses CHEST: No gynecomastia. LYMPH:  no palpable lymphadenopathy in the cervical, axillary or inguinal LUNGS: clear to auscultation and percussion with normal breathing effort HEART: regular rate & rhythm and no murmurs. ABDOMEN:abdomen soft, non-tender and normal bowel sounds. Distended with a positive fluid wave versus fat..  MUSCULOSKELETAL:no cyanosis of digits and no clubbing. Range of motion normal.  NEURO: alert & oriented x 3 with fluent speech, no focal motor/sensory deficits. No evidence of asterixis.   LABORATORY DATA: Infusion on 12/21/2013  Component Date Value Ref Range Status  . WBC 12/21/2013 1.2* 4.0 - 10.5 K/uL Final   Comment: RESULT REPEATED AND VERIFIED                          WHITE COUNT CONFIRMED ON SMEAR                          CRITICAL RESULT CALLED TO, READ BACK BY AND VERIFIED WITH:                          Anastasio Champion RN ON [id number] AT 0915 BY RESSEGGER R  . RBC 12/21/2013 3.45* 4.22 - 5.81 MIL/uL Final  . Hemoglobin 12/21/2013 10.9* 13.0 - 17.0 g/dL Final  . HCT 12/21/2013 32.0* 39.0 - 52.0 % Final  . MCV 12/21/2013 92.8  78.0 - 100.0 fL Final  . MCH 12/21/2013 31.6  26.0 - 34.0 pg Final  . MCHC 12/21/2013 34.1   30.0 - 36.0 g/dL Final  . RDW 12/21/2013 13.8  11.5 - 15.5 % Final  . Platelets 12/21/2013 50* 150 - 400 K/uL Final   Comment: RESULT REPEATED AND VERIFIED                          SPECIMEN CHECKED FOR CLOTS                          PLATELET COUNT CONFIRMED BY SMEAR  . Neutrophils Relative % 12/21/2013 50  43 - 77 % Final  . Neutro Abs 12/21/2013 0.6* 1.7 - 7.7 K/uL Final  . Lymphocytes Relative 12/21/2013 46  12 - 46 % Final  . Lymphs Abs 12/21/2013 0.6* 0.7 - 4.0 K/uL Final  . Monocytes Relative 12/21/2013 4  3 - 12 % Final  . Monocytes Absolute 12/21/2013 0.1  0.1 - 1.0 K/uL Final  . Eosinophils Relative 12/21/2013 0  0 - 5 % Final  .  Eosinophils Absolute 12/21/2013 0.0  0.0 - 0.7 K/uL Final  . Basophils Relative 12/21/2013 1  0 - 1 % Final  . Basophils Absolute 12/21/2013 0.0  0.0 - 0.1 K/uL Final  . Smear Review 12/21/2013 PLATELETS APPEAR DECREASED   Final  . Sodium 12/21/2013 137  137 - 147 mEq/L Final  . Potassium 12/21/2013 3.8  3.7 - 5.3 mEq/L Final  . Chloride 12/21/2013 100  96 - 112 mEq/L Final  . CO2 12/21/2013 25  19 - 32 mEq/L Final  . Glucose, Bld 12/21/2013 149* 70 - 99 mg/dL Final  . BUN 12/21/2013 28* 6 - 23 mg/dL Final  . Creatinine, Ser 12/21/2013 0.94  0.50 - 1.35 mg/dL Final  . Calcium 12/21/2013 9.2  8.4 - 10.5 mg/dL Final  . Total Protein 12/21/2013 5.9* 6.0 - 8.3 g/dL Final  . Albumin 12/21/2013 2.7* 3.5 - 5.2 g/dL Final  . AST 12/21/2013 48* 0 - 37 U/L Final  . ALT 12/21/2013 47  0 - 53 U/L Final  . Alkaline Phosphatase 12/21/2013 254* 39 - 117 U/L Final  . Total Bilirubin 12/21/2013 1.7* 0.3 - 1.2 mg/dL Final  . GFR calc non Af Amer 12/21/2013 84* >90 mL/min Final  . GFR calc Af Amer 12/21/2013 >90  >90 mL/min Final   Comment: (NOTE)                          The eGFR has been calculated using the CKD EPI equation.                          This calculation has not been validated in all clinical situations.                          eGFR's persistently  <90 mL/min signify possible Chronic Kidney                          Disease.  . Anion gap 12/21/2013 12  5 - 15 Final  Infusion on 12/19/2013  Component Date Value Ref Range Status  . WBC 12/19/2013 1.7* 4.0 - 10.5 K/uL Final  . RBC 12/19/2013 3.69* 4.22 - 5.81 MIL/uL Final  . Hemoglobin 12/19/2013 11.8* 13.0 - 17.0 g/dL Final  . HCT 12/19/2013 34.7* 39.0 - 52.0 % Final  . MCV 12/19/2013 94.0  78.0 - 100.0 fL Final  . MCH 12/19/2013 32.0  26.0 - 34.0 pg Final  . MCHC 12/19/2013 34.0  30.0 - 36.0 g/dL Final  . RDW 12/19/2013 14.0  11.5 - 15.5 % Final  . Platelets 12/19/2013 77* 150 - 400 K/uL Final   Comment: SPECIMEN CHECKED FOR CLOTS                          PLATELET COUNT CONFIRMED BY SMEAR  . Neutrophils Relative % 12/19/2013 78* 43 - 77 % Final  . Neutro Abs 12/19/2013 1.3* 1.7 - 7.7 K/uL Final  . Lymphocytes Relative 12/19/2013 20  12 - 46 % Final  . Lymphs Abs 12/19/2013 0.3* 0.7 - 4.0 K/uL Final  . Monocytes Relative 12/19/2013 1* 3 - 12 % Final  . Monocytes Absolute 12/19/2013 0.0* 0.1 - 1.0 K/uL Final  . Eosinophils Relative 12/19/2013 1  0 - 5 % Final  . Eosinophils Absolute 12/19/2013 0.0  0.0 - 0.7 K/uL Final  .  Basophils Relative 12/19/2013 1  0 - 1 % Final  . Basophils Absolute 12/19/2013 0.0  0.0 - 0.1 K/uL Final  . Sodium 12/19/2013 138  137 - 147 mEq/L Final  . Potassium 12/19/2013 3.8  3.7 - 5.3 mEq/L Final  . Chloride 12/19/2013 102  96 - 112 mEq/L Final  . CO2 12/19/2013 26  19 - 32 mEq/L Final  . Glucose, Bld 12/19/2013 187* 70 - 99 mg/dL Final  . BUN 12/19/2013 26* 6 - 23 mg/dL Final  . Creatinine, Ser 12/19/2013 0.84  0.50 - 1.35 mg/dL Final  . Calcium 12/19/2013 9.2  8.4 - 10.5 mg/dL Final  . GFR calc non Af Amer 12/19/2013 88* >90 mL/min Final  . GFR calc Af Amer 12/19/2013 >90  >90 mL/min Final   Comment: (NOTE)                          The eGFR has been calculated using the CKD EPI equation.                          This calculation has not been  validated in all clinical situations.                          eGFR's persistently <90 mL/min signify possible Chronic Kidney                          Disease.  . Anion gap 12/19/2013 10  5 - 15 Final  Infusion on 12/14/2013  Component Date Value Ref Range Status  . WBC 12/14/2013 2.4* 4.0 - 10.5 K/uL Final  . RBC 12/14/2013 4.01* 4.22 - 5.81 MIL/uL Final  . Hemoglobin 12/14/2013 12.8* 13.0 - 17.0 g/dL Final  . HCT 12/14/2013 37.8* 39.0 - 52.0 % Final  . MCV 12/14/2013 94.3  78.0 - 100.0 fL Final  . MCH 12/14/2013 31.9  26.0 - 34.0 pg Final  . MCHC 12/14/2013 33.9  30.0 - 36.0 g/dL Final  . RDW 12/14/2013 14.5  11.5 - 15.5 % Final  . Platelets 12/14/2013 139* 150 - 400 K/uL Final  . Neutrophils Relative % 12/14/2013 65  43 - 77 % Final  . Neutro Abs 12/14/2013 1.5* 1.7 - 7.7 K/uL Final  . Lymphocytes Relative 12/14/2013 20  12 - 46 % Final  . Lymphs Abs 12/14/2013 0.5* 0.7 - 4.0 K/uL Final  . Monocytes Relative 12/14/2013 14* 3 - 12 % Final  . Monocytes Absolute 12/14/2013 0.3  0.1 - 1.0 K/uL Final  . Eosinophils Relative 12/14/2013 1  0 - 5 % Final  . Eosinophils Absolute 12/14/2013 0.0  0.0 - 0.7 K/uL Final  . Basophils Relative 12/14/2013 0  0 - 1 % Final  . Basophils Absolute 12/14/2013 0.0  0.0 - 0.1 K/uL Final  . Retic Ct Pct 12/14/2013 1.5  0.4 - 3.1 % Final  . RBC. 12/14/2013 4.01* 4.22 - 5.81 MIL/uL Final  . Retic Count, Manual 12/14/2013 60.2  19.0 - 186.0 K/uL Final  . Sodium 12/14/2013 138  137 - 147 mEq/L Final  . Potassium 12/14/2013 3.6* 3.7 - 5.3 mEq/L Final  . Chloride 12/14/2013 103  96 - 112 mEq/L Final  . CO2 12/14/2013 23  19 - 32 mEq/L Final  . Glucose, Bld 12/14/2013 177* 70 - 99 mg/dL Final  . BUN 12/14/2013 18  6 -  23 mg/dL Final  . Creatinine, Ser 12/14/2013 0.82  0.50 - 1.35 mg/dL Final  . Calcium 12/14/2013 8.7  8.4 - 10.5 mg/dL Final  . Total Protein 12/14/2013 6.3  6.0 - 8.3 g/dL Final  . Albumin 12/14/2013 2.7* 3.5 - 5.2 g/dL Final  . AST  12/14/2013 49* 0 - 37 U/L Final  . ALT 12/14/2013 43  0 - 53 U/L Final  . Alkaline Phosphatase 12/14/2013 322* 39 - 117 U/L Final  . Total Bilirubin 12/14/2013 1.1  0.3 - 1.2 mg/dL Final  . GFR calc non Af Amer 12/14/2013 89* >90 mL/min Final  . GFR calc Af Amer 12/14/2013 >90  >90 mL/min Final   Comment: (NOTE)                          The eGFR has been calculated using the CKD EPI equation.                          This calculation has not been validated in all clinical situations.                          eGFR's persistently <90 mL/min signify possible Chronic Kidney                          Disease.  . Anion gap 12/14/2013 12  5 - 15 Final  Lab on 12/07/2013  Component Date Value Ref Range Status  . WBC 12/07/2013 0.8* 4.0 - 10.5 K/uL Final   Comment: EPITHELIAL CASTS                          WHITE COUNT CONFIRMED ON SMEAR                          CRITICAL RESULT CALLED TO, READ BACK BY AND VERIFIED WITH:                          SUE BURGESS RN ON [id number] AT 1030 BY RESSEGGER R  . RBC 12/07/2013 3.79* 4.22 - 5.81 MIL/uL Final  . Hemoglobin 12/07/2013 11.8* 13.0 - 17.0 g/dL Final  . HCT 12/07/2013 35.4* 39.0 - 52.0 % Final  . MCV 12/07/2013 93.4  78.0 - 100.0 fL Final  . MCH 12/07/2013 31.1  26.0 - 34.0 pg Final  . MCHC 12/07/2013 33.3  30.0 - 36.0 g/dL Final  . RDW 12/07/2013 13.8  11.5 - 15.5 % Final  . Platelets 12/07/2013 57* 150 - 400 K/uL Final  . Neutrophils Relative % 12/07/2013 21* 43 - 77 % Final  . Neutro Abs 12/07/2013 0.2* 1.7 - 7.7 K/uL Final  . Lymphocytes Relative 12/07/2013 57* 12 - 46 % Final  . Lymphs Abs 12/07/2013 0.4* 0.7 - 4.0 K/uL Final  . Monocytes Relative 12/07/2013 20* 3 - 12 % Final  . Monocytes Absolute 12/07/2013 0.2  0.1 - 1.0 K/uL Final  . Eosinophils Relative 12/07/2013 3  0 - 5 % Final  . Eosinophils Absolute 12/07/2013 0.0  0.0 - 0.7 K/uL Final  . Basophils Relative 12/07/2013 0  0 - 1 % Final  . Basophils Absolute 12/07/2013 0.0  0.0 - 0.1  K/uL Final  . Sodium 12/07/2013 138  137 - 147 mEq/L Final  .  Potassium 12/07/2013 4.2  3.7 - 5.3 mEq/L Final  . Chloride 12/07/2013 102  96 - 112 mEq/L Final  . CO2 12/07/2013 25  19 - 32 mEq/L Final  . Glucose, Bld 12/07/2013 236* 70 - 99 mg/dL Final  . BUN 12/07/2013 18  6 - 23 mg/dL Final  . Creatinine, Ser 12/07/2013 0.90  0.50 - 1.35 mg/dL Final  . Calcium 12/07/2013 9.1  8.4 - 10.5 mg/dL Final  . Total Protein 12/07/2013 6.2  6.0 - 8.3 g/dL Final  . Albumin 12/07/2013 2.8* 3.5 - 5.2 g/dL Final  . AST 12/07/2013 56* 0 - 37 U/L Final  . ALT 12/07/2013 72* 0 - 53 U/L Final  . Alkaline Phosphatase 12/07/2013 333* 39 - 117 U/L Final  . Total Bilirubin 12/07/2013 1.3* 0.3 - 1.2 mg/dL Final  . GFR calc non Af Amer 12/07/2013 85* >90 mL/min Final  . GFR calc Af Amer 12/07/2013 >90  >90 mL/min Final   Comment: (NOTE)                          The eGFR has been calculated using the CKD EPI equation.                          This calculation has not been validated in all clinical situations.                          eGFR's persistently <90 mL/min signify possible Chronic Kidney                          Disease.  . Anion gap 12/07/2013 11  5 - 15 Final  Infusion on 11/30/2013  Component Date Value Ref Range Status  . WBC 11/30/2013 0.6* 4.0 - 10.5 K/uL Final   Comment: RESULT REPEATED AND VERIFIED                          WHITE COUNT CONFIRMED ON SMEAR                          CRITICAL RESULT CALLED TO, READ BACK BY AND VERIFIED WITH:                          Caren Macadam ON [id number] AT 0935 BY RESSEGGER R  . RBC 11/30/2013 3.86* 4.22 - 5.81 MIL/uL Final  . Hemoglobin 11/30/2013 12.3* 13.0 - 17.0 g/dL Final  . HCT 11/30/2013 35.6* 39.0 - 52.0 % Final  . MCV 11/30/2013 92.2  78.0 - 100.0 fL Final  . MCH 11/30/2013 31.9  26.0 - 34.0 pg Final  . MCHC 11/30/2013 34.6  30.0 - 36.0 g/dL Final  . RDW 11/30/2013 13.6  11.5 - 15.5 % Final  . Platelets 11/30/2013 47* 150 - 400 K/uL Final    Comment: RESULT REPEATED AND VERIFIED                          PLATELET COUNT CONFIRMED BY SMEAR                          SPECIMEN CHECKED FOR CLOTS  . Neutrophils Relative % 11/30/2013 40* 43 - 77 % Final  .  Neutro Abs 11/30/2013 0.3* 1.7 - 7.7 K/uL Final  . Lymphocytes Relative 11/30/2013 56* 12 - 46 % Final  . Lymphs Abs 11/30/2013 0.4* 0.7 - 4.0 K/uL Final  . Monocytes Relative 11/30/2013 2* 3 - 12 % Final  . Monocytes Absolute 11/30/2013 0.0* 0.1 - 1.0 K/uL Final  . Eosinophils Relative 11/30/2013 2  0 - 5 % Final  . Eosinophils Absolute 11/30/2013 0.0  0.0 - 0.7 K/uL Final  . Basophils Relative 11/30/2013 2* 0 - 1 % Final  . Basophils Absolute 11/30/2013 0.0  0.0 - 0.1 K/uL Final  . Sodium 11/30/2013 133* 137 - 147 mEq/L Final  . Potassium 11/30/2013 3.9  3.7 - 5.3 mEq/L Final  . Chloride 11/30/2013 99  96 - 112 mEq/L Final  . CO2 11/30/2013 23  19 - 32 mEq/L Final  . Glucose, Bld 11/30/2013 247* 70 - 99 mg/dL Final  . BUN 11/30/2013 18  6 - 23 mg/dL Final  . Creatinine, Ser 11/30/2013 0.72  0.50 - 1.35 mg/dL Final  . Calcium 11/30/2013 9.1  8.4 - 10.5 mg/dL Final  . Total Protein 11/30/2013 6.4  6.0 - 8.3 g/dL Final  . Albumin 11/30/2013 2.6* 3.5 - 5.2 g/dL Final  . AST 11/30/2013 109* 0 - 37 U/L Final  . ALT 11/30/2013 123* 0 - 53 U/L Final  . Alkaline Phosphatase 11/30/2013 381* 39 - 117 U/L Final  . Total Bilirubin 11/30/2013 2.7* 0.3 - 1.2 mg/dL Final  . GFR calc non Af Amer 11/30/2013 >90  >90 mL/min Final  . GFR calc Af Amer 11/30/2013 >90  >90 mL/min Final   Comment: (NOTE)                          The eGFR has been calculated using the CKD EPI equation.                          This calculation has not been validated in all clinical situations.                          eGFR's persistently <90 mL/min signify possible Chronic Kidney                          Disease.  . Anion gap 11/30/2013 11  5 - 15 Final    PATHOLOGY: No new pathology.  Urinalysis No results  found for this basename: colorurine,  appearanceur,  labspec,  phurine,  glucoseu,  hgbur,  bilirubinur,  ketonesur,  proteinur,  urobilinogen,  nitrite,  leukocytesur    RADIOGRAPHIC STUDIES: No results found.  ASSESSMENT:  #1. 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  #2. 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 Nausea secondary to gastroparesis, improved on Reglan.  #4. Diabetes mellitus, type II, non-insulin requiring, not controlled.  #5. Hypertension, controlled.  #6. Neutropenia and thrombocytopenia from chemotherapy.  #7. Right inguinal hernia.  #8. Scalp rash and diarrhea secondary to Afatinib, now discontinued, improved.      PLAN:  #1. Chemotherapy today with decreased Gemzar dose to 600 mg per meter squared along with cisplatin. #2. Repeat CT scan chest ,abdomen, and pelvis in 1 week to assess response and compare to previous scan  done at [facility] on 10/31/2013. #3. Zofran ODT 8 mg to use instead of Reglan if not able to tolerate. #4. Followup on 12/29/2013 for discussion of CT results and to plan further intervention.   All questions were answered. The patient knows to call the clinic with any problems, questions or concerns. We can certainly see the patient much sooner if necessary.   I spent 30 minutes counseling the patient face to face. The total time spent in the appointment was 40 minutes.    Doroteo Bradford, MD 12/21/2013 10:12 AM  DISCLAIMER:  This note was dictated with voice recognition software.  Similar sounding words can inadvertently be transcribed inaccurately and may not be corrected upon review.  k

## 2013-12-28 ENCOUNTER — Other Ambulatory Visit (HOSPITAL_COMMUNITY): Payer: 59

## 2013-12-28 ENCOUNTER — Other Ambulatory Visit (HOSPITAL_COMMUNITY): Payer: Self-pay | Admitting: Hematology and Oncology

## 2013-12-28 ENCOUNTER — Ambulatory Visit (HOSPITAL_COMMUNITY)
Admission: RE | Admit: 2013-12-28 | Discharge: 2013-12-28 | Disposition: A | Discharge status: Home / Self Care | Payer: 59 | Source: Ambulatory Visit | Attending: Hematology and Oncology | Admitting: Hematology and Oncology

## 2013-12-28 DIAGNOSIS — R188 Other ascites: Secondary | ICD-10-CM | POA: Diagnosis not present

## 2013-12-28 DIAGNOSIS — C3411 Malignant neoplasm of upper lobe, right bronchus or lung: Secondary | ICD-10-CM

## 2013-12-28 DIAGNOSIS — C3490 Malignant neoplasm of unspecified part of unspecified bronchus or lung: Secondary | ICD-10-CM | POA: Diagnosis not present

## 2013-12-28 DIAGNOSIS — K769 Liver disease, unspecified: Secondary | ICD-10-CM | POA: Diagnosis not present

## 2013-12-28 DIAGNOSIS — C221 Intrahepatic bile duct carcinoma: Secondary | ICD-10-CM

## 2013-12-28 DIAGNOSIS — J90 Pleural effusion, not elsewhere classified: Secondary | ICD-10-CM | POA: Diagnosis not present

## 2013-12-28 DIAGNOSIS — Z9221 Personal history of antineoplastic chemotherapy: Secondary | ICD-10-CM | POA: Diagnosis not present

## 2013-12-28 MED ORDER — SODIUM CHLORIDE 0.9 % IJ SOLN
INTRAMUSCULAR | Status: AC
  Filled 2013-12-28: qty 45

## 2013-12-28 MED ORDER — SODIUM CHLORIDE 0.9 % IJ SOLN
INTRAMUSCULAR | Status: AC
  Filled 2013-12-28: qty 250

## 2013-12-28 MED ORDER — IOHEXOL 300 MG/ML  SOLN
100.0000 mL | Freq: Once | INTRAMUSCULAR | Status: AC | PRN
  Administered 2013-12-28: 100 mL via INTRAVENOUS

## 2013-12-29 ENCOUNTER — Encounter (HOSPITAL_BASED_OUTPATIENT_CLINIC_OR_DEPARTMENT_OTHER): Payer: 59

## 2013-12-29 ENCOUNTER — Telehealth (HOSPITAL_COMMUNITY): Payer: Self-pay

## 2013-12-29 ENCOUNTER — Ambulatory Visit (HOSPITAL_COMMUNITY)
Admission: RE | Admit: 2013-12-29 | Discharge: 2013-12-29 | Disposition: A | Discharge status: Home / Self Care | Payer: 59 | Source: Ambulatory Visit | Attending: Hematology and Oncology | Admitting: Hematology and Oncology

## 2013-12-29 ENCOUNTER — Emergency Department (HOSPITAL_COMMUNITY)
Admission: EM | Admit: 2013-12-29 | Discharge: 2013-12-29 | Disposition: A | Discharge status: Home / Self Care | Payer: 59 | Attending: Emergency Medicine | Admitting: Emergency Medicine

## 2013-12-29 ENCOUNTER — Ambulatory Visit (HOSPITAL_COMMUNITY)
Admission: RE | Admit: 2013-12-29 | Discharge: 2013-12-29 | Disposition: A | Discharge status: Home / Self Care | Payer: 59 | Source: Ambulatory Visit | Attending: Diagnostic Radiology | Admitting: Diagnostic Radiology

## 2013-12-29 ENCOUNTER — Encounter (HOSPITAL_COMMUNITY): Payer: Self-pay | Admitting: Emergency Medicine

## 2013-12-29 VITALS — BP 129/63 | HR 108 | Temp 98.6°F | Resp 20 | Wt 214.5 lb

## 2013-12-29 DIAGNOSIS — E119 Type 2 diabetes mellitus without complications: Secondary | ICD-10-CM

## 2013-12-29 DIAGNOSIS — C3490 Malignant neoplasm of unspecified part of unspecified bronchus or lung: Secondary | ICD-10-CM | POA: Insufficient documentation

## 2013-12-29 DIAGNOSIS — J90 Pleural effusion, not elsewhere classified: Secondary | ICD-10-CM | POA: Diagnosis not present

## 2013-12-29 DIAGNOSIS — Z792 Long term (current) use of antibiotics: Secondary | ICD-10-CM | POA: Insufficient documentation

## 2013-12-29 DIAGNOSIS — D696 Thrombocytopenia, unspecified: Secondary | ICD-10-CM | POA: Diagnosis not present

## 2013-12-29 DIAGNOSIS — Z7982 Long term (current) use of aspirin: Secondary | ICD-10-CM | POA: Insufficient documentation

## 2013-12-29 DIAGNOSIS — Z79899 Other long term (current) drug therapy: Secondary | ICD-10-CM | POA: Insufficient documentation

## 2013-12-29 DIAGNOSIS — T50905A Adverse effect of unspecified drugs, medicaments and biological substances, initial encounter: Principal | ICD-10-CM

## 2013-12-29 DIAGNOSIS — C3410 Malignant neoplasm of upper lobe, unspecified bronchus or lung: Secondary | ICD-10-CM

## 2013-12-29 DIAGNOSIS — I129 Hypertensive chronic kidney disease with stage 1 through stage 4 chronic kidney disease, or unspecified chronic kidney disease: Secondary | ICD-10-CM | POA: Diagnosis not present

## 2013-12-29 DIAGNOSIS — Z85118 Personal history of other malignant neoplasm of bronchus and lung: Secondary | ICD-10-CM | POA: Insufficient documentation

## 2013-12-29 DIAGNOSIS — C221 Intrahepatic bile duct carcinoma: Secondary | ICD-10-CM

## 2013-12-29 DIAGNOSIS — Z87442 Personal history of urinary calculi: Secondary | ICD-10-CM | POA: Diagnosis not present

## 2013-12-29 DIAGNOSIS — M129 Arthropathy, unspecified: Secondary | ICD-10-CM | POA: Diagnosis not present

## 2013-12-29 DIAGNOSIS — R109 Unspecified abdominal pain: Secondary | ICD-10-CM | POA: Diagnosis not present

## 2013-12-29 DIAGNOSIS — N189 Chronic kidney disease, unspecified: Secondary | ICD-10-CM | POA: Diagnosis not present

## 2013-12-29 DIAGNOSIS — R21 Rash and other nonspecific skin eruption: Secondary | ICD-10-CM

## 2013-12-29 DIAGNOSIS — I10 Essential (primary) hypertension: Secondary | ICD-10-CM

## 2013-12-29 DIAGNOSIS — R0602 Shortness of breath: Secondary | ICD-10-CM | POA: Diagnosis present

## 2013-12-29 DIAGNOSIS — D6959 Other secondary thrombocytopenia: Secondary | ICD-10-CM

## 2013-12-29 DIAGNOSIS — R197 Diarrhea, unspecified: Secondary | ICD-10-CM

## 2013-12-29 DIAGNOSIS — Z87891 Personal history of nicotine dependence: Secondary | ICD-10-CM | POA: Diagnosis not present

## 2013-12-29 DIAGNOSIS — D702 Other drug-induced agranulocytosis: Secondary | ICD-10-CM

## 2013-12-29 LAB — CBC WITH DIFFERENTIAL/PLATELET
BASOS PCT: 0 % (ref 0–1)
Basophils Absolute: 0 10*3/uL (ref 0.0–0.1)
Eosinophils Absolute: 0 10*3/uL (ref 0.0–0.7)
Eosinophils Relative: 0 % (ref 0–5)
HCT: 26 % — ABNORMAL LOW (ref 39.0–52.0)
HEMOGLOBIN: 9 g/dL — AB (ref 13.0–17.0)
Lymphocytes Relative: 75 % — ABNORMAL HIGH (ref 12–46)
Lymphs Abs: 0.4 10*3/uL — ABNORMAL LOW (ref 0.7–4.0)
MCH: 31.7 pg (ref 26.0–34.0)
MCHC: 34.6 g/dL (ref 30.0–36.0)
MCV: 91.5 fL (ref 78.0–100.0)
MONOS PCT: 2 % — AB (ref 3–12)
Monocytes Absolute: 0 10*3/uL — ABNORMAL LOW (ref 0.1–1.0)
NEUTROS PCT: 24 % — AB (ref 43–77)
Neutro Abs: 0.1 10*3/uL — ABNORMAL LOW (ref 1.7–7.7)
RBC: 2.84 MIL/uL — ABNORMAL LOW (ref 4.22–5.81)
RDW: 13.5 % (ref 11.5–15.5)
SMEAR REVIEW: DECREASED
WBC: 0.6 10*3/uL — CL (ref 4.0–10.5)

## 2013-12-29 LAB — BODY FLUID CELL COUNT WITH DIFFERENTIAL
EOS FL: 0 %
Lymphs, Fluid: 2 %
MONOCYTE-MACROPHAGE-SEROUS FLUID: 0 % — AB (ref 50–90)
Neutrophil Count, Fluid: 98 % — ABNORMAL HIGH (ref 0–25)
Total Nucleated Cell Count, Fluid: 2529 cu mm — ABNORMAL HIGH (ref 0–1000)

## 2013-12-29 LAB — COMPREHENSIVE METABOLIC PANEL
ALBUMIN: 2.6 g/dL — AB (ref 3.5–5.2)
ALK PHOS: 226 U/L — AB (ref 39–117)
ALT: 42 U/L (ref 0–53)
AST: 33 U/L (ref 0–37)
Anion gap: 16 — ABNORMAL HIGH (ref 5–15)
BUN: 41 mg/dL — ABNORMAL HIGH (ref 6–23)
CO2: 23 mEq/L (ref 19–32)
Calcium: 9.8 mg/dL (ref 8.4–10.5)
Chloride: 99 mEq/L (ref 96–112)
Creatinine, Ser: 1.21 mg/dL (ref 0.50–1.35)
GFR calc non Af Amer: 60 mL/min — ABNORMAL LOW (ref >90)
GFR, EST AFRICAN AMERICAN: 69 mL/min — AB (ref >90)
GLUCOSE: 212 mg/dL — AB (ref 70–99)
POTASSIUM: 4.4 meq/L (ref 3.7–5.3)
SODIUM: 138 meq/L (ref 137–147)
TOTAL PROTEIN: 6.2 g/dL (ref 6.0–8.3)
Total Bilirubin: 1.9 mg/dL — ABNORMAL HIGH (ref 0.3–1.2)

## 2013-12-29 LAB — TYPE AND SCREEN
ABO/RH(D): A NEG
ANTIBODY SCREEN: NEGATIVE

## 2013-12-29 LAB — LACTATE DEHYDROGENASE, PLEURAL OR PERITONEAL FLUID: LD, Fluid: 68 U/L — ABNORMAL HIGH (ref 3–23)

## 2013-12-29 LAB — GLUCOSE, SEROUS FLUID: Glucose, Fluid: 196 mg/dL

## 2013-12-29 MED ORDER — OXYCODONE HCL 15 MG PO TABS: ORAL_TABLET | ORAL | Status: AC

## 2013-12-29 MED ORDER — METOCLOPRAMIDE HCL 10 MG PO TABS
ORAL_TABLET | ORAL | Status: AC
  Administered 2013-12-29: 10 mg via ORAL
  Filled 2013-12-29: qty 1

## 2013-12-29 MED ORDER — SODIUM CHLORIDE 0.9 % IV SOLN
10.0000 mL/h | Freq: Once | INTRAVENOUS | Status: AC
  Administered 2013-12-29: 10 mL/h via INTRAVENOUS

## 2013-12-29 MED ORDER — METOCLOPRAMIDE HCL 10 MG PO TABS
10.0000 mg | ORAL_TABLET | Freq: Once | ORAL | Status: AC
  Administered 2013-12-29: 10 mg via ORAL

## 2013-12-29 MED ORDER — PANTOPRAZOLE SODIUM 40 MG PO TBEC
40.0000 mg | DELAYED_RELEASE_TABLET | Freq: Every day | ORAL | Status: DC
  Administered 2013-12-29: 40 mg via ORAL

## 2013-12-29 MED ORDER — OXYCODONE HCL 5 MG PO TABS
ORAL_TABLET | ORAL | Status: AC
  Administered 2013-12-29: 15 mg via ORAL
  Filled 2013-12-29: qty 3

## 2013-12-29 MED ORDER — OXYCODONE HCL 5 MG PO TABS
15.0000 mg | ORAL_TABLET | Freq: Once | ORAL | Status: AC
  Administered 2013-12-29: 15 mg via ORAL

## 2013-12-29 MED ORDER — PANTOPRAZOLE SODIUM 40 MG PO TBEC
DELAYED_RELEASE_TABLET | ORAL | Status: AC
  Administered 2013-12-29: 40 mg via ORAL
  Filled 2013-12-29: qty 1

## 2013-12-29 NOTE — Telephone Encounter (Signed)
CRITICAL VALUE ALERT Critical value received:  WBC 0.6 and Platelet count <5,000 Date of notification:  12/29/13 Time of notification: 3668 Critical value read back:  Yes.   Nurse who received alert:  Mickie Kay, RN MD notified Dr. Barnet Glasgow @ [phone]

## 2013-12-29 NOTE — Discharge Instructions (Signed)
Thoracentesis A thoracentesis procedure is done to remove fluid that has built up in the space between your chest wall and your lungs (pleural space). Although you always have a small amount of fluid in the pleural space, some medical conditions, such as heart failure, can create too much fluid. This extra fluid is removed using a needle inserted through the skin and tissue and into the pleural space.  A thoracentesis may be done to:  Understand why there is extra fluid and create the treatment plan that is right for you.  Help get rid of shortness of breath, discomfort, or pain caused by the extra fluid. LET [facility] CARE PROVIDER KNOW ABOUT:  Any allergies you have.  All medicines you are taking, including vitamins, herbs, eye drops, creams, and over-the-counter medicines. Be sure to mention any use of steroids, either by mouth or cream.  Previous problems you or members of your family have had with the use of anesthetics.  Possibility of pregnancy, if this applies.  Any blood disorders you have, including a history of blood clots.  Previous surgeries you have had.  Medical conditions you have. RISKS AND COMPLICATIONS Generally, this is a safe procedure. However, problems can occur and include:   Injury to the lung.  Possible infection.  Possible collapse of a lung. BEFORE THE PROCEDURE  Ask your health care provider if you need to arrive early before your procedure.  Inform your health care provider if you have had a frequent cough. If you have had frequent coughing episodes, your health care provider may want you to take a cough suppressant.  You may have a chest X-ray to determine the location and amount of fluid in the pleural space.  Ask your health care provider about:  Changing or stopping your regular medicines. This is especially important if you are taking diabetes medicines or blood thinners.  Taking medicines such as aspirin and ibuprofen. These medicines  can thin your blood. Do not take these medicines before your procedure if your health care provider asks you not to. PROCEDURE  You may be asked to sit upright and lean slightly forward for the procedure.  An area of your back will be cleaned and disinfected.  A numbing medicine (local anesthetic) may then be injected into the skin and tissue.  A needle will be inserted between your ribs and advanced into the pleural space. You may feel pressure or slight pain as the needle is positioned into the pleural space.  Fluid will be removed from the pleural space through the needle. You may feel pressure as the fluid is removed.  The needle will be withdrawn once the excess fluid has been removed. A sample of the fluid may be sent for examination.  The puncture site may be covered with a bandage (dressing). AFTER THE PROCEDURE  A chest X-ray may be done.  Your recovery will be assessed and monitored. If there are no problems, you should be able to go home shortly after the procedure.  Ask your health care provider when your test results will be ready. Make sure to get your test results.  Follow your health care provider's instructions on dressing changes and removal. Victoria may resume your normal diet and activities as directed by your health care provider.  Take medicines only as directed by your health care provider. SEEK MEDICAL CARE IF:   You have drainage, redness, swelling, or pain at your puncture site.  You have a fever.  SEEK IMMEDIATE MEDICAL CARE IF:  You have shortness of breath or chest pain.  Document Released: 10/20/2004 Document Revised: 08/21/2013 Document Reviewed: 02/02/2008 [facility] Patient Information 2015 [place], [place]. This information is not intended to replace advice given to you by your health care provider. Make sure you discuss any questions you have with your health care provider.

## 2013-12-29 NOTE — Progress Notes (Signed)
[facility]  OFFICE PROGRESS NOTE  Bryce Bogus, MD [address] [place] [address]  DIAGNOSIS: Thrombocytopenia due to drugs - Plan: Care order/instruction, 0.9 %  sodium chloride infusion, sodium chloride 0.9 % injection 10 mL, heparin lock flush 100 unit/mL, heparin lock flush 100 unit/mL, sodium chloride 0.9 % injection 3 mL, Transfuse Pheresed Platelets, acetaminophen (TYLENOL) tablet 650 mg, diphenhydrAMINE (BENADRYL) capsule 25 mg, Practitioner attestation of consent, 0.9 %  sodium chloride infusion, sodium chloride 0.9 % injection 10 mL, heparin lock flush 100 unit/mL, heparin lock flush 100 unit/mL, sodium chloride 0.9 % injection 3 mL, Type and screen, Prepare Pheresed Platelets, Transfuse Pheresed Platelets, Type and screen, Prepare Pheresed Platelets, Care order/instruction, Practitioner attestation of consent  Pleural effusion, right  No chief complaint on file.   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 Non-small cell lung cancer, EGFR mutated had previously been treated with Afatinib which was stopped because of skin rash and diarrhea.  INTERVAL HISTORY: Bryce Daugherty 69 y.o. male returns for followup after completion of 2 cycles of chemotherapy for progressive cholangiocarcinoma, intrahepatic, present in conjunction with non-small cell lung cancer, EGFR mutated which had previously been treated with Afatinib, medication stop because of development of skin rash and diarrhea. Followup CT scan was done yesterday to assess response. She is developed significant right pleural effusion which was drained 1400 cc earlier this morning. He's had easy bruising. He has increasing abdominal pain with lower 70 swelling with no fever, night sweats, or severe epistaxis, melena, or hematochezia. He denies any incontinence.  He does intermittently confused. He denies any cough or expectoration. He develop increasing shortness of breath at rest with no headache or focal weakness.  MEDICAL HISTORY: Past Medical History  Diagnosis Date  . Hypertension   . Chronic kidney disease   . Arthritis     lower back, hips knees  . Kidney stones     bilateral stones pain since 04/11/11  . Diabetes mellitus     treatment for 2 years  . 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 Adenocarcinoma, lung     s/p radiation treatments    INTERIM HISTORY: has Ureteral stone; Dilation of biliary tract; Jaundice; Loss of weight; Obstructive jaundice; Type II or unspecified type diabetes mellitus without mention of complication, not stated as uncontrolled; Unspecified essential hypertension; Lung cancer, upper lobe; Intrahepatic cholangiocarcinoma; and Ascites on his problem list.    ALLERGIES:  has No Known Allergies.  MEDICATIONS: has a current medication list which includes the following prescription(s): afatinib dimaleate, aspirin ec, atenolol, ciprofloxacin, diphenoxylate-atropine, doxazosin, glipizide, metformin, metoclopramide, omeprazole, ondansetron, oxycodone, prochlorperazine, and acetaminophen.  SURGICAL HISTORY:  Past Surgical History  Procedure Laterality Date  . Cystectomy  under left arm  . Colonoscopy  03/24/2011    XTK:[id number] hemorrhoids  . Cholecystectomy      1984  . Catarac both eyes    . Tonsillectomy    . Ercp N/A 08/03/2013    Dr. Gala Romney: with sphincterotomy, balloon dilation, stone extraction, plastic stent placement. Tbili 26 pre-operatively  . Sphincterotomy N/A 08/03/2013    Procedure: SPHINCTEROTOMY;  Surgeon: Daneil Dolin, MD;  Location: [facility];  Service: Endoscopy;  Laterality: N/A;  with dilation   . Biliary stent placement N/A 08/03/2013    Procedure: BILIARY STENT PLACEMENT;  Surgeon: Daneil Dolin, MD;  Location: [facility];  Service: Endoscopy;  Laterality: N/A;  . Removal of stones N/A 08/03/2013     Procedure: REMOVAL OF STONES;  Surgeon: Daneil Dolin, MD;  Location: [facility];  Service: Endoscopy;  Laterality: N/A;  . Ercp N/A 10/12/2013    Procedure: ENDOSCOPIC RETROGRADE CHOLANGIOPANCREATOGRAPHY (ERCP);  Surgeon: Daneil Dolin, MD;  Location: [facility];  Service: Endoscopy;  Laterality: N/A;  . Biliary stent placement N/A 10/12/2013    Procedure: BILIARY STENT REPLACEMENT;  Surgeon: Daneil Dolin, MD;  Location: [facility];  Service: Endoscopy;  Laterality: N/A;  . Lung biopsy  08/31/13    FAMILY HISTORY: family history includes Heart attack in his father; Pancreatic cancer in his mother. There is no history of Colon cancer.  SOCIAL HISTORY:  reports that he quit smoking about 25 years ago. His smoking use included Cigarettes. He has a 20 pack-year smoking history. He has never used smokeless tobacco. He reports that he does not drink alcohol or use illicit drugs.  REVIEW OF SYSTEMS:  Other than that discussed above is noncontributory.  PHYSICAL EXAMINATION: ECOG PERFORMANCE STATUS: 3 - Symptomatic, >50% confined to bed  Blood pressure 129/63, pulse 108, temperature 98.6 F (37 C), temperature source Oral, resp. rate 20, weight 214 lb 8 oz (97.297 kg), SpO2 97.00%.  GENERAL:alert, no distress and comfortable. Short of breath even while speaking. SKIN: skin color, texture, turgor are normal, no rashes or significant lesions EYES: PERLA; Conjunctiva are pink and non-injected, sclera clear SINUSES: No redness or tenderness over maxillary or ethmoid sinuses OROPHARYNX:no exudate, no erythema on lips, buccal mucosa, or tongue. NECK: supple, thyroid normal size, non-tender, without nodularity. No masses CHEST: Normal AP diameter with no breast masses. LYMPH:  no palpable lymphadenopathy in the cervical, axillary or inguinal LUNGS: Decreased breath sounds in the right with dullness to percussion. No rales or rhonchi. HEART: regular rate & rhythm and no murmurs. ABDOMEN: Free fluid wave or  shifting dullness with rockhard consistency. No CVA tenderness. Right inguinal hernia easily reducible. MUSCULOSKELETAL:no cyanosis of digits and no clubbing. Range of motion normal.  NEURO: alert & oriented x 3 with fluent speech, no focal motor/sensory deficits. No asterixis.   LABORATORY DATA: Office Visit on 12/29/2013  Component Date Value Ref Range Status  . Glucose, Fluid 12/29/2013 196   Final   Comment:                                 FLUID GLUCOSE LEVELS OF <60                          mg/dL OR VALUES OF 40 mg/dL                          LESS THAN A SIMULTANEOUS                          SERUM LEVEL ARE CONSIDERED                          DECREASED.  Marland Kitchen Fluid Type-FGLU 12/29/2013 FLUID   Final   PLEURAL  Lab on 12/29/2013  Component Date Value Ref Range Status  . WBC 12/29/2013 0.6* 4.0 - 10.5 K/uL Final   Comment: RESULT REPEATED AND VERIFIED  WHITE COUNT CONFIRMED ON SMEAR                          CRITICAL RESULT CALLED TO, READ BACK BY AND VERIFIED WITH:                          SUE BURGESS RN ON [id number] AT 1225 BY RESSEGGER R  . RBC 12/29/2013 2.84* 4.22 - 5.81 MIL/uL Final  . Hemoglobin 12/29/2013 9.0* 13.0 - 17.0 g/dL Final  . HCT 12/29/2013 26.0* 39.0 - 52.0 % Final  . MCV 12/29/2013 91.5  78.0 - 100.0 fL Final  . MCH 12/29/2013 31.7  26.0 - 34.0 pg Final  . MCHC 12/29/2013 34.6  30.0 - 36.0 g/dL Final  . RDW 12/29/2013 13.5  11.5 - 15.5 % Final  . Platelets 12/29/2013 <5* 150 - 400 K/uL Final   Comment: RESULT REPEATED AND VERIFIED                          PLATELET COUNT CONFIRMED BY SMEAR                          CRITICAL RESULT CALLED TO, READ BACK BY AND VERIFIED WITH:                          SUE BURGESS RN ON [id number] AT 1225 BY RESSEGGER R  . Neutrophils Relative % 12/29/2013 24* 43 - 77 % Final  . Neutro Abs 12/29/2013 0.1* 1.7 - 7.7 K/uL Final  . Lymphocytes Relative 12/29/2013 75* 12 - 46 % Final  . Lymphs Abs 12/29/2013 0.4* 0.7 -  4.0 K/uL Final  . Monocytes Relative 12/29/2013 2* 3 - 12 % Final  . Monocytes Absolute 12/29/2013 0.0* 0.1 - 1.0 K/uL Final  . Eosinophils Relative 12/29/2013 0  0 - 5 % Final  . Eosinophils Absolute 12/29/2013 0.0  0.0 - 0.7 K/uL Final  . Basophils Relative 12/29/2013 0  0 - 1 % Final  . Basophils Absolute 12/29/2013 0.0  0.0 - 0.1 K/uL Final  . Smear Review 12/29/2013 PLATELETS APPEAR DECREASED   Final  . Sodium 12/29/2013 138  137 - 147 mEq/L Final  . Potassium 12/29/2013 4.4  3.7 - 5.3 mEq/L Final  . Chloride 12/29/2013 99  96 - 112 mEq/L Final  . CO2 12/29/2013 23  19 - 32 mEq/L Final  . Glucose, Bld 12/29/2013 212* 70 - 99 mg/dL Final  . BUN 12/29/2013 41* 6 - 23 mg/dL Final  . Creatinine, Ser 12/29/2013 1.21  0.50 - 1.35 mg/dL Final  . Calcium 12/29/2013 9.8  8.4 - 10.5 mg/dL Final  . Total Protein 12/29/2013 6.2  6.0 - 8.3 g/dL Final  . Albumin 12/29/2013 2.6* 3.5 - 5.2 g/dL Final  . AST 12/29/2013 33  0 - 37 U/L Final  . ALT 12/29/2013 42  0 - 53 U/L Final  . Alkaline Phosphatase 12/29/2013 226* 39 - 117 U/L Final  . Total Bilirubin 12/29/2013 1.9* 0.3 - 1.2 mg/dL Final  . GFR calc non Af Amer 12/29/2013 60* >90 mL/min Final  . GFR calc Af Amer 12/29/2013 69* >90 mL/min Final   Comment: (NOTE)                          The  eGFR has been calculated using the CKD EPI equation.                          This calculation has not been validated in all clinical situations.                          eGFR's persistently <90 mL/min signify possible Chronic Kidney                          Disease.  Georgiann Hahn gap 12/29/2013 16* 5 - 15 Final  Hospital Outpatient Visit on 12/29/2013  Component Date Value Ref Range Status  . Fluid Type-FCT 12/29/2013 FLUID   Corrected   Comment: PLEURAL                          CORRECTED ON 09/11 AT 1139: PREVIOUSLY REPORTED AS PLEURAL  . Color, Fluid 12/29/2013 YELLOW  YELLOW Final  . Appearance, Fluid 12/29/2013 CLOUDY  CLEAR Final  . WBC, Fluid  12/29/2013 2529* 0 - 1000 cu mm Final  . Neutrophil Count, Fluid 12/29/2013 98* 0 - 25 % Final  . Lymphs, Fluid 12/29/2013 2   Final  . Monocyte-Macrophage-Serous Fluid 12/29/2013 0* 50 - 90 % Final  . Eos, Fluid 12/29/2013 0   Final  . Other Cells, Fluid 12/29/2013 FEW LARGE ATYPICAL CELLS   SPECIMEN HAS CYTOLOGY ORDERED   Final  . LD, Fluid 12/29/2013 68* 3 - 23 U/L Final  . Fluid Type-FLDH 12/29/2013 PLEURAL   Final  Infusion on 12/21/2013  Component Date Value Ref Range Status  . WBC 12/21/2013 1.2* 4.0 - 10.5 K/uL Final   Comment: RESULT REPEATED AND VERIFIED                          WHITE COUNT CONFIRMED ON SMEAR                          CRITICAL RESULT CALLED TO, READ BACK BY AND VERIFIED WITH:                          Anastasio Champion RN ON [id number] AT 0915 BY RESSEGGER R  . RBC 12/21/2013 3.45* 4.22 - 5.81 MIL/uL Final  . Hemoglobin 12/21/2013 10.9* 13.0 - 17.0 g/dL Final  . HCT 12/21/2013 32.0* 39.0 - 52.0 % Final  . MCV 12/21/2013 92.8  78.0 - 100.0 fL Final  . MCH 12/21/2013 31.6  26.0 - 34.0 pg Final  . MCHC 12/21/2013 34.1  30.0 - 36.0 g/dL Final  . RDW 12/21/2013 13.8  11.5 - 15.5 % Final  . Platelets 12/21/2013 50* 150 - 400 K/uL Final   Comment: RESULT REPEATED AND VERIFIED                          SPECIMEN CHECKED FOR CLOTS                          PLATELET COUNT CONFIRMED BY SMEAR  . Neutrophils Relative % 12/21/2013 50  43 - 77 % Final  . Neutro Abs 12/21/2013 0.6* 1.7 - 7.7 K/uL Final  . Lymphocytes Relative 12/21/2013 46  12 - 46 % Final  .  Lymphs Abs 12/21/2013 0.6* 0.7 - 4.0 K/uL Final  . Monocytes Relative 12/21/2013 4  3 - 12 % Final  . Monocytes Absolute 12/21/2013 0.1  0.1 - 1.0 K/uL Final  . Eosinophils Relative 12/21/2013 0  0 - 5 % Final  . Eosinophils Absolute 12/21/2013 0.0  0.0 - 0.7 K/uL Final  . Basophils Relative 12/21/2013 1  0 - 1 % Final  . Basophils Absolute 12/21/2013 0.0  0.0 - 0.1 K/uL Final  . Smear Review 12/21/2013 PLATELETS APPEAR  DECREASED   Final  . Sodium 12/21/2013 137  137 - 147 mEq/L Final  . Potassium 12/21/2013 3.8  3.7 - 5.3 mEq/L Final  . Chloride 12/21/2013 100  96 - 112 mEq/L Final  . CO2 12/21/2013 25  19 - 32 mEq/L Final  . Glucose, Bld 12/21/2013 149* 70 - 99 mg/dL Final  . BUN 12/21/2013 28* 6 - 23 mg/dL Final  . Creatinine, Ser 12/21/2013 0.94  0.50 - 1.35 mg/dL Final  . Calcium 12/21/2013 9.2  8.4 - 10.5 mg/dL Final  . Total Protein 12/21/2013 5.9* 6.0 - 8.3 g/dL Final  . Albumin 12/21/2013 2.7* 3.5 - 5.2 g/dL Final  . AST 12/21/2013 48* 0 - 37 U/L Final  . ALT 12/21/2013 47  0 - 53 U/L Final  . Alkaline Phosphatase 12/21/2013 254* 39 - 117 U/L Final  . Total Bilirubin 12/21/2013 1.7* 0.3 - 1.2 mg/dL Final  . GFR calc non Af Amer 12/21/2013 84* >90 mL/min Final  . GFR calc Af Amer 12/21/2013 >90  >90 mL/min Final   Comment: (NOTE)                          The eGFR has been calculated using the CKD EPI equation.                          This calculation has not been validated in all clinical situations.                          eGFR's persistently <90 mL/min signify possible Chronic Kidney                          Disease.  . Anion gap 12/21/2013 12  5 - 15 Final  Infusion on 12/19/2013  Component Date Value Ref Range Status  . WBC 12/19/2013 1.7* 4.0 - 10.5 K/uL Final  . RBC 12/19/2013 3.69* 4.22 - 5.81 MIL/uL Final  . Hemoglobin 12/19/2013 11.8* 13.0 - 17.0 g/dL Final  . HCT 12/19/2013 34.7* 39.0 - 52.0 % Final  . MCV 12/19/2013 94.0  78.0 - 100.0 fL Final  . MCH 12/19/2013 32.0  26.0 - 34.0 pg Final  . MCHC 12/19/2013 34.0  30.0 - 36.0 g/dL Final  . RDW 12/19/2013 14.0  11.5 - 15.5 % Final  . Platelets 12/19/2013 77* 150 - 400 K/uL Final   Comment: SPECIMEN CHECKED FOR CLOTS                          PLATELET COUNT CONFIRMED BY SMEAR  . Neutrophils Relative % 12/19/2013 78* 43 - 77 % Final  . Neutro Abs 12/19/2013 1.3* 1.7 - 7.7 K/uL Final  . Lymphocytes Relative 12/19/2013 20  12 - 46  % Final  . Lymphs Abs 12/19/2013 0.3* 0.7 - 4.0  K/uL Final  . Monocytes Relative 12/19/2013 1* 3 - 12 % Final  . Monocytes Absolute 12/19/2013 0.0* 0.1 - 1.0 K/uL Final  . Eosinophils Relative 12/19/2013 1  0 - 5 % Final  . Eosinophils Absolute 12/19/2013 0.0  0.0 - 0.7 K/uL Final  . Basophils Relative 12/19/2013 1  0 - 1 % Final  . Basophils Absolute 12/19/2013 0.0  0.0 - 0.1 K/uL Final  . Sodium 12/19/2013 138  137 - 147 mEq/L Final  . Potassium 12/19/2013 3.8  3.7 - 5.3 mEq/L Final  . Chloride 12/19/2013 102  96 - 112 mEq/L Final  . CO2 12/19/2013 26  19 - 32 mEq/L Final  . Glucose, Bld 12/19/2013 187* 70 - 99 mg/dL Final  . BUN 12/19/2013 26* 6 - 23 mg/dL Final  . Creatinine, Ser 12/19/2013 0.84  0.50 - 1.35 mg/dL Final  . Calcium 12/19/2013 9.2  8.4 - 10.5 mg/dL Final  . GFR calc non Af Amer 12/19/2013 88* >90 mL/min Final  . GFR calc Af Amer 12/19/2013 >90  >90 mL/min Final   Comment: (NOTE)                          The eGFR has been calculated using the CKD EPI equation.                          This calculation has not been validated in all clinical situations.                          eGFR's persistently <90 mL/min signify possible Chronic Kidney                          Disease.  . Anion gap 12/19/2013 10  5 - 15 Final  Infusion on 12/14/2013  Component Date Value Ref Range Status  . WBC 12/14/2013 2.4* 4.0 - 10.5 K/uL Final  . RBC 12/14/2013 4.01* 4.22 - 5.81 MIL/uL Final  . Hemoglobin 12/14/2013 12.8* 13.0 - 17.0 g/dL Final  . HCT 12/14/2013 37.8* 39.0 - 52.0 % Final  . MCV 12/14/2013 94.3  78.0 - 100.0 fL Final  . MCH 12/14/2013 31.9  26.0 - 34.0 pg Final  . MCHC 12/14/2013 33.9  30.0 - 36.0 g/dL Final  . RDW 12/14/2013 14.5  11.5 - 15.5 % Final  . Platelets 12/14/2013 139* 150 - 400 K/uL Final  . Neutrophils Relative % 12/14/2013 65  43 - 77 % Final  . Neutro Abs 12/14/2013 1.5* 1.7 - 7.7 K/uL Final  . Lymphocytes Relative 12/14/2013 20  12 - 46 % Final  . Lymphs  Abs 12/14/2013 0.5* 0.7 - 4.0 K/uL Final  . Monocytes Relative 12/14/2013 14* 3 - 12 % Final  . Monocytes Absolute 12/14/2013 0.3  0.1 - 1.0 K/uL Final  . Eosinophils Relative 12/14/2013 1  0 - 5 % Final  . Eosinophils Absolute 12/14/2013 0.0  0.0 - 0.7 K/uL Final  . Basophils Relative 12/14/2013 0  0 - 1 % Final  . Basophils Absolute 12/14/2013 0.0  0.0 - 0.1 K/uL Final  . Retic Ct Pct 12/14/2013 1.5  0.4 - 3.1 % Final  . RBC. 12/14/2013 4.01* 4.22 - 5.81 MIL/uL Final  . Retic Count, Manual 12/14/2013 60.2  19.0 - 186.0 K/uL Final  . Sodium 12/14/2013 138  137 - 147 mEq/L Final  .  Potassium 12/14/2013 3.6* 3.7 - 5.3 mEq/L Final  . Chloride 12/14/2013 103  96 - 112 mEq/L Final  . CO2 12/14/2013 23  19 - 32 mEq/L Final  . Glucose, Bld 12/14/2013 177* 70 - 99 mg/dL Final  . BUN 12/14/2013 18  6 - 23 mg/dL Final  . Creatinine, Ser 12/14/2013 0.82  0.50 - 1.35 mg/dL Final  . Calcium 12/14/2013 8.7  8.4 - 10.5 mg/dL Final  . Total Protein 12/14/2013 6.3  6.0 - 8.3 g/dL Final  . Albumin 12/14/2013 2.7* 3.5 - 5.2 g/dL Final  . AST 12/14/2013 49* 0 - 37 U/L Final  . ALT 12/14/2013 43  0 - 53 U/L Final  . Alkaline Phosphatase 12/14/2013 322* 39 - 117 U/L Final  . Total Bilirubin 12/14/2013 1.1  0.3 - 1.2 mg/dL Final  . GFR calc non Af Amer 12/14/2013 89* >90 mL/min Final  . GFR calc Af Amer 12/14/2013 >90  >90 mL/min Final   Comment: (NOTE)                          The eGFR has been calculated using the CKD EPI equation.                          This calculation has not been validated in all clinical situations.                          eGFR's persistently <90 mL/min signify possible Chronic Kidney                          Disease.  . Anion gap 12/14/2013 12  5 - 15 Final  Lab on 12/07/2013  Component Date Value Ref Range Status  . WBC 12/07/2013 0.8* 4.0 - 10.5 K/uL Final   Comment: EPITHELIAL CASTS                          WHITE COUNT CONFIRMED ON SMEAR                          CRITICAL  RESULT CALLED TO, READ BACK BY AND VERIFIED WITH:                          SUE BURGESS RN ON [id number] AT 1030 BY RESSEGGER R  . RBC 12/07/2013 3.79* 4.22 - 5.81 MIL/uL Final  . Hemoglobin 12/07/2013 11.8* 13.0 - 17.0 g/dL Final  . HCT 12/07/2013 35.4* 39.0 - 52.0 % Final  . MCV 12/07/2013 93.4  78.0 - 100.0 fL Final  . MCH 12/07/2013 31.1  26.0 - 34.0 pg Final  . MCHC 12/07/2013 33.3  30.0 - 36.0 g/dL Final  . RDW 12/07/2013 13.8  11.5 - 15.5 % Final  . Platelets 12/07/2013 57* 150 - 400 K/uL Final  . Neutrophils Relative % 12/07/2013 21* 43 - 77 % Final  . Neutro Abs 12/07/2013 0.2* 1.7 - 7.7 K/uL Final  . Lymphocytes Relative 12/07/2013 57* 12 - 46 % Final  . Lymphs Abs 12/07/2013 0.4* 0.7 - 4.0 K/uL Final  . Monocytes Relative 12/07/2013 20* 3 - 12 % Final  . Monocytes Absolute 12/07/2013 0.2  0.1 - 1.0 K/uL Final  . Eosinophils Relative 12/07/2013 3  0 - 5 % Final  .  Eosinophils Absolute 12/07/2013 0.0  0.0 - 0.7 K/uL Final  . Basophils Relative 12/07/2013 0  0 - 1 % Final  . Basophils Absolute 12/07/2013 0.0  0.0 - 0.1 K/uL Final  . Sodium 12/07/2013 138  137 - 147 mEq/L Final  . Potassium 12/07/2013 4.2  3.7 - 5.3 mEq/L Final  . Chloride 12/07/2013 102  96 - 112 mEq/L Final  . CO2 12/07/2013 25  19 - 32 mEq/L Final  . Glucose, Bld 12/07/2013 236* 70 - 99 mg/dL Final  . BUN 12/07/2013 18  6 - 23 mg/dL Final  . Creatinine, Ser 12/07/2013 0.90  0.50 - 1.35 mg/dL Final  . Calcium 12/07/2013 9.1  8.4 - 10.5 mg/dL Final  . Total Protein 12/07/2013 6.2  6.0 - 8.3 g/dL Final  . Albumin 12/07/2013 2.8* 3.5 - 5.2 g/dL Final  . AST 12/07/2013 56* 0 - 37 U/L Final  . ALT 12/07/2013 72* 0 - 53 U/L Final  . Alkaline Phosphatase 12/07/2013 333* 39 - 117 U/L Final  . Total Bilirubin 12/07/2013 1.3* 0.3 - 1.2 mg/dL Final  . GFR calc non Af Amer 12/07/2013 85* >90 mL/min Final  . GFR calc Af Amer 12/07/2013 >90  >90 mL/min Final   Comment: (NOTE)                          The eGFR has been  calculated using the CKD EPI equation.                          This calculation has not been validated in all clinical situations.                          eGFR's persistently <90 mL/min signify possible Chronic Kidney                          Disease.  . Anion gap 12/07/2013 11  5 - 15 Final  Infusion on 11/30/2013  Component Date Value Ref Range Status  . WBC 11/30/2013 0.6* 4.0 - 10.5 K/uL Final   Comment: RESULT REPEATED AND VERIFIED                          WHITE COUNT CONFIRMED ON SMEAR                          CRITICAL RESULT CALLED TO, READ BACK BY AND VERIFIED WITH:                          Caren Macadam ON [id number] AT 0935 BY RESSEGGER R  . RBC 11/30/2013 3.86* 4.22 - 5.81 MIL/uL Final  . Hemoglobin 11/30/2013 12.3* 13.0 - 17.0 g/dL Final  . HCT 11/30/2013 35.6* 39.0 - 52.0 % Final  . MCV 11/30/2013 92.2  78.0 - 100.0 fL Final  . MCH 11/30/2013 31.9  26.0 - 34.0 pg Final  . MCHC 11/30/2013 34.6  30.0 - 36.0 g/dL Final  . RDW 11/30/2013 13.6  11.5 - 15.5 % Final  . Platelets 11/30/2013 47* 150 - 400 K/uL Final   Comment: RESULT REPEATED AND VERIFIED                          PLATELET  COUNT CONFIRMED BY SMEAR                          SPECIMEN CHECKED FOR CLOTS  . Neutrophils Relative % 11/30/2013 40* 43 - 77 % Final  . Neutro Abs 11/30/2013 0.3* 1.7 - 7.7 K/uL Final  . Lymphocytes Relative 11/30/2013 56* 12 - 46 % Final  . Lymphs Abs 11/30/2013 0.4* 0.7 - 4.0 K/uL Final  . Monocytes Relative 11/30/2013 2* 3 - 12 % Final  . Monocytes Absolute 11/30/2013 0.0* 0.1 - 1.0 K/uL Final  . Eosinophils Relative 11/30/2013 2  0 - 5 % Final  . Eosinophils Absolute 11/30/2013 0.0  0.0 - 0.7 K/uL Final  . Basophils Relative 11/30/2013 2* 0 - 1 % Final  . Basophils Absolute 11/30/2013 0.0  0.0 - 0.1 K/uL Final  . Sodium 11/30/2013 133* 137 - 147 mEq/L Final  . Potassium 11/30/2013 3.9  3.7 - 5.3 mEq/L Final  . Chloride 11/30/2013 99  96 - 112 mEq/L Final  . CO2 11/30/2013 23  19 - 32  mEq/L Final  . Glucose, Bld 11/30/2013 247* 70 - 99 mg/dL Final  . BUN 11/30/2013 18  6 - 23 mg/dL Final  . Creatinine, Ser 11/30/2013 0.72  0.50 - 1.35 mg/dL Final  . Calcium 11/30/2013 9.1  8.4 - 10.5 mg/dL Final  . Total Protein 11/30/2013 6.4  6.0 - 8.3 g/dL Final  . Albumin 11/30/2013 2.6* 3.5 - 5.2 g/dL Final  . AST 11/30/2013 109* 0 - 37 U/L Final  . ALT 11/30/2013 123* 0 - 53 U/L Final  . Alkaline Phosphatase 11/30/2013 381* 39 - 117 U/L Final  . Total Bilirubin 11/30/2013 2.7* 0.3 - 1.2 mg/dL Final  . GFR calc non Af Amer 11/30/2013 >90  >90 mL/min Final  . GFR calc Af Amer 11/30/2013 >90  >90 mL/min Final   Comment: (NOTE)                          The eGFR has been calculated using the CKD EPI equation.                          This calculation has not been validated in all clinical situations.                          eGFR's persistently <90 mL/min signify possible Chronic Kidney                          Disease.  . Anion gap 11/30/2013 11  5 - 15 Final    PATHOLOGY: No new pathology.  Urinalysis No results found for this basename: colorurine,  appearanceur,  labspec,  phurine,  glucoseu,  hgbur,  bilirubinur,  ketonesur,  proteinur,  urobilinogen,  nitrite,  leukocytesur    RADIOGRAPHIC STUDIES: Dg Chest 1 View  12/29/2013   CLINICAL DATA:  RIGHT pleural effusion post thoracentesis, history RIGHT lung cancer, cholangiocarcinoma  EXAM: CHEST - 1 VIEW  COMPARISON:  Expiratory PA chest radiograph compared to CT chest of 12/28/2013  FINDINGS: Persistent large RIGHT pleural effusion despite preceding removal of 1700 mL of fluid from the RIGHT hemi thorax.  Persistent atelectasis and opacification of the mid and inferior portions of the RIGHT lung.  No RIGHT pneumothorax post thoracentesis.  Question minimal infiltrate at LEFT lung  base.  Nodular density at lower LEFT chest 11 mm diameter identified, corresponding to nodule on CT.  No acute osseous findings.  IMPRESSION: Persistent  large RIGHT pleural effusion and significant RIGHT lung atelectasis despite removal of 1700 mL of fluid from the RIGHT hemi thorax.  No pneumothorax post thoracentesis.   Electronically Signed   By: Lavonia Dana M.D.   On: 12/29/2013 11:56   Ct Chest W Contrast  12/28/2013   CLINICAL DATA:  Cholangiocarcinoma and right lung cancer.  EXAM: CT CHEST, ABDOMEN, AND PELVIS WITH CONTRAST  TECHNIQUE: Multidetector CT imaging of the chest, abdomen and pelvis was performed following the standard protocol during bolus administration of intravenous contrast.  CONTRAST:  14m OMNIPAQUE IOHEXOL 300 MG/ML  SOLN  COMPARISON:  PET-CT 08/25/2013  FINDINGS: CT CHEST FINDINGS  There is a large right pleural effusion. There is atelectasis of the right lower lobe and right middle lobe. Left lung is clear.  No axillary or supraclavicular lymphadenopathy. No mediastinal hilar lymphadenopathy.  Soft tissue nodule in the left back just below the skin surface is not hypermetabolic on comparison PET-CT scan (image number [id number]).  CT ABDOMEN AND PELVIS FINDINGS  Liver is shrunken. There is low attenuation within the central left hepatic lobe at site of infiltrating lesion on comparison PET-CT scan. There is a biliary stent in place extending from the right extrahepatic duct through the common bile duct into the duodenum. No biliary duct dilatation. The pancreas, spleen, adrenal glands, and kidneys are normal. There is a nonobstructing calculus measuring 5 mm in the central left kidney.  Stomach, small bowel, and colon are normal.  Abdominal aorta is normal caliber. No retroperitoneal or periportal lymphadenopathy.  There is a moderate volume of free fluid within the abdomen and pelvis. Free fluid extends into a right inguinal hernia.  Prostate gland and bladder are normal. No pelvic lymphadenopathy. No aggressive osseous lesion.  IMPRESSION: Chest Impression:  1. Interval development of a large right pleural effusion with atelectasis of the  right middle lobe and right lower lobe. 2. No metastatic adenopathy .  Abdomen / Pelvis Impression:  1. Interval increase in ascites within the abdomen or pelvis. 2. Infiltrative mass within the left hepatic lobe is poorly demonstrated. 3. Biliary stent placed without evidence of intrahepatic biliary duct dilatation. 4. No evidence of metastatic disease in the abdomen or pelvis.   Electronically Signed   By: SSuzy BouchardM.D.   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  EXAM: CT CHEST, ABDOMEN, AND PELVIS WITH CONTRAST  TECHNIQUE: Multidetector CT imaging of the chest, abdomen and pelvis was performed following the standard protocol during bolus administration of intravenous contrast.  CONTRAST:  1077mOMNIPAQUE IOHEXOL 300 MG/ML  SOLN  COMPARISON:  PET-CT 08/25/2013  FINDINGS: CT CHEST FINDINGS  There is a large right pleural effusion. There is atelectasis of the right lower lobe and right middle lobe. Left lung is clear.  No axillary or supraclavicular lymphadenopathy. No mediastinal hilar lymphadenopathy.  Soft tissue nodule in the left back just below the skin surface is not hypermetabolic on comparison PET-CT scan (image number 17).  CT ABDOMEN AND PELVIS FINDINGS  Liver is shrunken. There is low attenuation within the central left hepatic lobe at site of infiltrating lesion on comparison PET-CT scan. There is a biliary stent in place extending from the right extrahepatic duct through the common bile duct into the duodenum.  No biliary duct dilatation. The pancreas, spleen, adrenal glands, and kidneys are normal. There is a nonobstructing calculus measuring 5 mm in the central left kidney.  Stomach, small bowel, and colon are normal.  Abdominal aorta is normal caliber. No retroperitoneal or periportal lymphadenopathy.  There is a moderate volume of free fluid within the abdomen and pelvis. Free fluid extends into a right  inguinal hernia.  Prostate gland and bladder are normal. No pelvic lymphadenopathy. No aggressive osseous lesion.  IMPRESSION: Chest Impression:  1. Interval development of a large right pleural effusion with atelectasis of the right middle lobe and right lower lobe. 2. No metastatic adenopathy .  Abdomen / Pelvis Impression:  1. Interval increase in ascites within the abdomen or pelvis. 2. Infiltrative mass within the left hepatic lobe is poorly demonstrated. 3. Biliary stent placed without evidence of intrahepatic biliary duct dilatation. 4. No evidence of metastatic disease in the abdomen or pelvis.   Electronically Signed   By: Suzy Bouchard M.D.   On: 12/28/2013 08:39   US Thoracentesis Asp Pleural Space W/img Guide  12/29/2013   CLINICAL DATA:  Cholangiocarcinoma, adenocarcinoma RIGHT lung, large RIGHT pleural effusion  EXAM: [place] RIGHT THORACENTESIS ASP PLEURAL SPACE W/IMG GUIDE  TECHNIQUE: Procedure, benefits, and risks of procedure were discussed with patient.  Written informed consent for procedure was obtained.  Time out protocol followed.  Pleural effusion localized by ultrasound at the posterior RIGHT hemithorax.  Skin prepped and draped in usual sterile fashion.  Skin and soft tissues anesthetized with 10 mL of 1% lidocaine.  8 French thoracentesis catheter placed into the RIGHT pleural space.  1700 mL of amber colored fluid aspirated by syringe pump.  No immediate complication.  Patient experienced posterior inferior RIGHT thoracic pain at conclusion of exam.  Patient denied shortness of breath and improvement in breathing after procedure.  Fluid sent to laboratory for requested analysis.  COMPARISON:  CT chest 12/28/2013  FINDINGS: As above  IMPRESSION: Ultrasound guided RIGHT thoracentesis with removal of 1700 mL of amber colored fluid from the RIGHT hemithorax.   Electronically Signed   By: Lavonia Dana M.D.   On: 12/29/2013 11:54    ASSESSMENT:  #1. Progressive lung cancer and  cholangiocarcinoma, terminally ill. #2. Neutropenia and thrombocytopenia secondary to chemotherapy. #3. Diabetes mellitus, type II, non-insulin requiring, not controlled.  #4. Hypertension, controlled.  #5. Neutropenia and thrombocytopenia from chemotherapy.  #6. Right inguinal hernia, easily reducible.  #7. Scalp rash and diarrhea secondary to Afatinib, now discontinued, improved    PLAN:  #1. Transfuse platelets today and again on 01/01/2014. #2. After transfusion and correction of platelet count, patient will be referred to hospice.   All questions were answered. The patient knows to call the clinic with any problems, questions or concerns. We can certainly see the patient much sooner if necessary.   I spent 30 minutes counseling the patient face to face. The total time spent in the appointment was 40 minutes.    Doroteo Bradford, MD 12/29/2013 1:53 PM  DISCLAIMER:  This note was dictated with voice recognition software.  Similar sounding words can inadvertently be transcribed inaccurately and may not be corrected upon review.

## 2013-12-29 NOTE — Procedures (Signed)
PreOperative Dx: RIGHT pleural effusion, adenocarcinoma RIGHT lung Postoperative Dx: RIGHT pleural effusion, adenocarcinoma RIGHT lung Procedure:   US guided RIGHT thoracentesis Radiologist:  Thornton Papas Anesthesia:  10 ml of 1 lidocaine Specimen:  1700 ml of amber colored fluid EBL:   < 1 ml Complications: None; patient c/o pain at posterior inferior RIGHT hemithorax at conclusion of procedure but experienced no shortness of breath

## 2013-12-29 NOTE — Progress Notes (Signed)
Bryce Daugherty Discharged to the ER to receive platelets via wheelchair.  Report given to [facility].

## 2013-12-29 NOTE — Addendum Note (Signed)
Addended by: Elenor Legato on: 12/29/2013 04:08 PM   Modules accepted: Orders, SmartSet

## 2013-12-29 NOTE — ED Notes (Signed)
Dr. Betsey Holiday notified platelets completed.

## 2013-12-29 NOTE — Addendum Note (Signed)
Addended by: Elenor Legato on: 12/29/2013 04:11 PM   Modules accepted: Miquel Dunn

## 2013-12-29 NOTE — ED Provider Notes (Signed)
CSN: [id number]     Arrival date & time 12/29/13  1424 History   None    This chart was scribed for Orpah Greek, * by Edison Simon, ED Scribe. This patient was seen in room APA11/APA11 and the patient's care was started at 2:19 PM.   Chief Complaint  Patient presents with  . Shortness of Breath  . low platelets    The history is provided by the patient. No language interpreter was used.   HPI Comments: Bryce Daugherty is a 68 y.o. male who presents to the Emergency Department referred here for low platelet count from the cancer center, complaining of shortness of breath. He also reports increased liver pain in the past 3 days. He reports he has cancer to his lungs and bile duct for which he has received radiation and chemotherapy treatment. He reports a CAT scan yesterday that showed effusion to lungs; he had thoracentesis done at the cancer center today, at which time he was told he has low platelet count and was referred here for admission for platelet transfusion. His doctor wanted to refer him to hospice care but said he needed to have his platelet count higher first.  Past Medical History  Diagnosis Date  . Hypertension   . Chronic kidney disease   . Arthritis     lower back, hips knees  . Kidney stones     bilateral stones pain since 04/11/11  . Diabetes mellitus     treatment for 2 years  . 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 Adenocarcinoma, lung     s/p radiation treatments   Past Surgical History  Procedure Laterality Date  . Cystectomy  under left arm  . Colonoscopy  03/24/2011    [id number] hemorrhoids  . Cholecystectomy      1984  . Catarac both eyes    . Tonsillectomy    . Ercp N/A 08/03/2013    Dr. Gala Romney: with sphincterotomy, balloon dilation, stone extraction, plastic stent placement. Tbili 26 pre-operatively  . Sphincterotomy N/A 08/03/2013    Procedure: SPHINCTEROTOMY;  Surgeon: Daneil Dolin, MD;  Location: [facility];  Service: Endoscopy;  Laterality: N/A;   with dilation   . Biliary stent placement N/A 08/03/2013    Procedure: BILIARY STENT PLACEMENT;  Surgeon: Daneil Dolin, MD;  Location: [facility];  Service: Endoscopy;  Laterality: N/A;  . Removal of stones N/A 08/03/2013    Procedure: REMOVAL OF STONES;  Surgeon: Daneil Dolin, MD;  Location: [facility];  Service: Endoscopy;  Laterality: N/A;  . Ercp N/A 10/12/2013    Procedure: ENDOSCOPIC RETROGRADE CHOLANGIOPANCREATOGRAPHY (ERCP);  Surgeon: Daneil Dolin, MD;  Location: [facility];  Service: Endoscopy;  Laterality: N/A;  . Biliary stent placement N/A 10/12/2013    Procedure: BILIARY STENT REPLACEMENT;  Surgeon: Daneil Dolin, MD;  Location: [facility];  Service: Endoscopy;  Laterality: N/A;  . Lung biopsy  08/31/13   Family History  Problem Relation Age of Onset  . Pancreatic cancer Mother     age 51  . Colon cancer Neg Hx   . Heart attack Father    History  Substance Use Topics  . Smoking status: Former Smoker -- 1.00 packs/day for 20 years    Types: Cigarettes    Quit date: 04/26/1988  . Smokeless tobacco: Never Used     Comment: quit smoking about 25 years ago  . Alcohol Use: No    Review of Systems  Respiratory: Positive for shortness of breath.  Gastrointestinal: Positive for abdominal pain.  Hematological:       Low platelet count      Allergies  Review of patient's allergies indicates no known allergies.  Home Medications   Prior to Admission medications   Medication Sig Start Date End Date Taking? Authorizing Provider  acetaminophen (TYLENOL) 500 MG tablet Take 500 mg by mouth every 6 (six) hours as needed.    Historical Provider, MD  afatinib dimaleate (GILOTRIF) 30 MG tablet Take 30 mg by mouth daily. Take on an empty stomach 1hr before or 2hrs after meals. Started 8/11    Historical Provider, MD  aspirin EC 81 MG tablet Take 81 mg by mouth daily. **patient holding due to procedure**    Historical Provider, MD  atenolol (TENORMIN) 25 MG tablet Take 25 mg by mouth daily  before breakfast.     Historical Provider, MD  ciprofloxacin (CIPRO) 500 MG tablet Take 1 tablet daily for 10 days or as directed. 11/30/13   Farrel Gobble, MD  diphenoxylate-atropine (LOMOTIL) 2.5-0.025 MG per tablet Take 3 tablets every 2-4 hours as needed for diarrhea. 12/19/13   Farrel Gobble, MD  doxazosin (CARDURA) 8 MG tablet Take 4 mg by mouth daily before breakfast.     Historical Provider, MD  glipiZIDE (GLUCOTROL) 5 MG tablet Take 5 mg by mouth daily before breakfast.     Historical Provider, MD  metFORMIN (GLUCOPHAGE) 500 MG tablet Take 1 tablet twice a day 12/14/13   Farrel Gobble, MD  metoCLOPramide (REGLAN) 5 MG tablet Take one or two tablets four times a day before meals and at bedtime. 12/14/13   Farrel Gobble, MD  omeprazole (PRILOSEC) 20 MG capsule Take 20 mg twice a day. 12/14/13   Farrel Gobble, MD  ondansetron (ZOFRAN ODT) 8 MG disintegrating tablet 1 tablet on tongue up to every 8 hours to control nausea 12/21/13   Farrel Gobble, MD  oxyCODONE (ROXICODONE) 15 MG immediate release tablet Take 1-2 tablets every 2-4 hours as needed for pain. 12/29/13   Farrel Gobble, MD  prochlorperazine (COMPAZINE) 10 MG tablet Take 1 tablet (10 mg total) by mouth every 6 (six) hours as needed for nausea or vomiting. 11/19/13   Farrel Gobble, MD   BP 107/83  Pulse 112  Resp 14  SpO2 93% Physical Exam  Nursing note and vitals reviewed. Constitutional: He is oriented to person, place, and time. He appears well-developed and well-nourished. No distress.  HENT:  Head: Normocephalic and atraumatic.  Right Ear: Hearing normal.  Left Ear: Hearing normal.  Nose: Nose normal.  Mouth/Throat: Oropharynx is clear and moist and mucous membranes are normal.  Eyes: Conjunctivae and EOM are normal. Pupils are equal, round, and reactive to light.  Neck: Normal range of motion. Neck supple.  Cardiovascular: Regular rhythm, S1 normal and S2 normal.  Exam reveals no gallop and no friction  rub.   No murmur heard. Pulmonary/Chest: Effort normal. No respiratory distress. He exhibits no tenderness.  Decreased breath sounds to right lower lobe  Abdominal: Soft. Normal appearance and bowel sounds are normal. There is no hepatosplenomegaly. There is tenderness (RUQ tenderness). There is no rebound, no guarding, no tenderness at McBurney's point and negative Murphy's sign. No hernia.  Musculoskeletal: Normal range of motion.  Neurological: He is alert and oriented to person, place, and time. He has normal strength. No cranial nerve deficit or sensory deficit. Coordination normal. GCS eye subscore is 4. GCS verbal subscore is 5. GCS motor subscore is 6.  Skin: Skin  is warm, dry and intact. No rash noted. No cyanosis.  Psychiatric: He has a normal mood and affect. His speech is normal and behavior is normal. Thought content normal.    ED Course  Procedures (including critical care time) Labs Review Labs Reviewed - No data to display  Imaging Review Dg Chest 1 View  12/29/2013   CLINICAL DATA:  RIGHT pleural effusion post thoracentesis, history RIGHT lung cancer, cholangiocarcinoma  EXAM: CHEST - 1 VIEW  COMPARISON:  Expiratory PA chest radiograph compared to CT chest of 12/28/2013  FINDINGS: Persistent large RIGHT pleural effusion despite preceding removal of 1700 mL of fluid from the RIGHT hemi thorax.  Persistent atelectasis and opacification of the mid and inferior portions of the RIGHT lung.  No RIGHT pneumothorax post thoracentesis.  Question minimal infiltrate at LEFT lung base.  Nodular density at lower LEFT chest 11 mm diameter identified, corresponding to nodule on CT.  No acute osseous findings.  IMPRESSION: Persistent large RIGHT pleural effusion and significant RIGHT lung atelectasis despite removal of 1700 mL of fluid from the RIGHT hemi thorax.  No pneumothorax post thoracentesis.   Electronically Signed   By: Lavonia Dana M.D.   On: 12/29/2013 11:56   Ct Chest W  Contrast  12/28/2013   CLINICAL DATA:  Cholangiocarcinoma and right lung cancer.  EXAM: CT CHEST, ABDOMEN, AND PELVIS WITH CONTRAST  TECHNIQUE: Multidetector CT imaging of the chest, abdomen and pelvis was performed following the standard protocol during bolus administration of intravenous contrast.  CONTRAST:  163mL OMNIPAQUE IOHEXOL 300 MG/ML  SOLN  COMPARISON:  PET-CT 08/25/2013  FINDINGS: CT CHEST FINDINGS  There is a large right pleural effusion. There is atelectasis of the right lower lobe and right middle lobe. Left lung is clear.  No axillary or supraclavicular lymphadenopathy. No mediastinal hilar lymphadenopathy.  Soft tissue nodule in the left back just below the skin surface is not hypermetabolic on comparison PET-CT scan (image number 17).  CT ABDOMEN AND PELVIS FINDINGS  Liver is shrunken. There is low attenuation within the central left hepatic lobe at site of infiltrating lesion on comparison PET-CT scan. There is a biliary stent in place extending from the right extrahepatic duct through the common bile duct into the duodenum. No biliary duct dilatation. The pancreas, spleen, adrenal glands, and kidneys are normal. There is a nonobstructing calculus measuring 5 mm in the central left kidney.  Stomach, small bowel, and colon are normal.  Abdominal aorta is normal caliber. No retroperitoneal or periportal lymphadenopathy.  There is a moderate volume of free fluid within the abdomen and pelvis. Free fluid extends into a right inguinal hernia.  Prostate gland and bladder are normal. No pelvic lymphadenopathy. No aggressive osseous lesion.  IMPRESSION: Chest Impression:  1. Interval development of a large right pleural effusion with atelectasis of the right middle lobe and right lower lobe. 2. No metastatic adenopathy .  Abdomen / Pelvis Impression:  1. Interval increase in ascites within the abdomen or pelvis. 2. Infiltrative mass within the left hepatic lobe is poorly demonstrated. 3. Biliary stent  placed without evidence of intrahepatic biliary duct dilatation. 4. No evidence of metastatic disease in the abdomen or pelvis.   Electronically Signed   By: Suzy Bouchard M.D.   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  EXAM: CT CHEST, ABDOMEN, AND PELVIS WITH CONTRAST  TECHNIQUE: Multidetector CT imaging of the chest, abdomen  and pelvis was performed following the standard protocol during bolus administration of intravenous contrast.  CONTRAST:  169mL OMNIPAQUE IOHEXOL 300 MG/ML  SOLN  COMPARISON:  PET-CT 08/25/2013  FINDINGS: CT CHEST FINDINGS  There is a large right pleural effusion. There is atelectasis of the right lower lobe and right middle lobe. Left lung is clear.  No axillary or supraclavicular lymphadenopathy. No mediastinal hilar lymphadenopathy.  Soft tissue nodule in the left back just below the skin surface is not hypermetabolic on comparison PET-CT scan (image number 17).  CT ABDOMEN AND PELVIS FINDINGS  Liver is shrunken. There is low attenuation within the central left hepatic lobe at site of infiltrating lesion on comparison PET-CT scan. There is a biliary stent in place extending from the right extrahepatic duct through the common bile duct into the duodenum. No biliary duct dilatation. The pancreas, spleen, adrenal glands, and kidneys are normal. There is a nonobstructing calculus measuring 5 mm in the central left kidney.  Stomach, small bowel, and colon are normal.  Abdominal aorta is normal caliber. No retroperitoneal or periportal lymphadenopathy.  There is a moderate volume of free fluid within the abdomen and pelvis. Free fluid extends into a right inguinal hernia.  Prostate gland and bladder are normal. No pelvic lymphadenopathy. No aggressive osseous lesion.  IMPRESSION: Chest Impression:  1. Interval development of a large right pleural effusion with atelectasis of the right middle lobe and right  lower lobe. 2. No metastatic adenopathy .  Abdomen / Pelvis Impression:  1. Interval increase in ascites within the abdomen or pelvis. 2. Infiltrative mass within the left hepatic lobe is poorly demonstrated. 3. Biliary stent placed without evidence of intrahepatic biliary duct dilatation. 4. No evidence of metastatic disease in the abdomen or pelvis.   Electronically Signed   By: Suzy Bouchard M.D.   On: 12/28/2013 08:39   US Thoracentesis Asp Pleural Space W/img Guide  12/29/2013   CLINICAL DATA:  Cholangiocarcinoma, adenocarcinoma RIGHT lung, large RIGHT pleural effusion  EXAM: [place] RIGHT THORACENTESIS ASP PLEURAL SPACE W/IMG GUIDE  TECHNIQUE: Procedure, benefits, and risks of procedure were discussed with patient.  Written informed consent for procedure was obtained.  Time out protocol followed.  Pleural effusion localized by ultrasound at the posterior RIGHT hemithorax.  Skin prepped and draped in usual sterile fashion.  Skin and soft tissues anesthetized with 10 mL of 1% lidocaine.  8 French thoracentesis catheter placed into the RIGHT pleural space.  1700 mL of amber colored fluid aspirated by syringe pump.  No immediate complication.  Patient experienced posterior inferior RIGHT thoracic pain at conclusion of exam.  Patient denied shortness of breath and improvement in breathing after procedure.  Fluid sent to laboratory for requested analysis.  COMPARISON:  CT chest 12/28/2013  FINDINGS: As above  IMPRESSION: Ultrasound guided RIGHT thoracentesis with removal of 1700 mL of amber colored fluid from the RIGHT hemithorax.   Electronically Signed   By: Lavonia Dana M.D.   On: 12/29/2013 11:54     EKG Interpretation None     DIAGNOSTIC STUDIES: Oxygen Saturation is 93% on room air, adequate by my interpretation.    COORDINATION OF CARE:    MDM   Final diagnoses:  None   pancytopenia secondary to chemotherapy  As referred to the emergency department by oncology to receive platelets. Patient  was found to have a platelet count of 5000 today during evaluation in the oncology Center. Patient's oncologist was concerned about the possibility of spontaneous bleeding and her  suggested transfusion. This has not had any active bleeding.  Patient had a pleural effusion drained today. Postprocedure x-ray did not show any evidence of pneumothorax. No concern for acute bilious no blood or active bleeding. Patient has persistent atelectasis resulting in his mild dyspnea.  As the patient is about to enter hospice care, it is felt that he would not benefit from admission to the hospital. He was referred to the ER specifically for platelet transfusion. Arrangements will be made for the patient to receive 1 unit of platelets with discharge after transfusion. Patient already has follow up Monday morning with oncology for repeat evaluation and possible repeat infusion of platelets.  I personally performed the services described in this documentation, which was scribed in my presence. The recorded information has been reviewed and is accurate.      Orpah Greek, MD 12/29/13 [phone]

## 2013-12-29 NOTE — Progress Notes (Addendum)
Labs for [facility] ADDED TYSCR FOR PLATELETS

## 2013-12-29 NOTE — Patient Instructions (Signed)
[facility] Discharge Instructions  RECOMMENDATIONS MADE BY THE CONSULTANT AND ANY TEST RESULTS WILL BE SENT TO YOUR REFERRING PHYSICIAN.  EXAM FINDINGS BY THE PHYSICIAN TODAY AND SIGNS OR SYMPTOMS TO REPORT TO CLINIC OR PRIMARY PHYSICIAN: You saw Dr Barnet Glasgow today  You will receive platelets today in the ER.  Come back Monday to receive platelets at the clinic.    Thank you for choosing [facility] to provide your oncology and hematology care.  To afford each patient quality time with our providers, please arrive at least 15 minutes before your scheduled appointment time.  With your help, our goal is to use those 15 minutes to complete the necessary work-up to ensure our physicians have the information they need to help with your evaluation and healthcare recommendations.    Effective January 1st, 2014, we ask that you re-schedule your appointment with our physicians should you arrive 10 or more minutes late for your appointment.  We strive to give you quality time with our providers, and arriving late affects you and other patients whose appointments are after yours.    Again, thank you for choosing [facility].  Our hope is that these requests will decrease the amount of time that you wait before being seen by our physicians.       _____________________________________________________________  Should you have questions after your visit to [facility], please contact our office at [phone] between the hours of 8:30 a.m. and 5:00 p.m.  Voicemails left after 4:30 p.m. will not be returned until the following business day.  For prescription refill requests, have your pharmacy contact our office with your prescription refill request.

## 2013-12-29 NOTE — Discharge Instructions (Signed)
Thrombocytopenia Thrombocytopenia is a condition in which there is an abnormally small number of platelets in your blood. Platelets are also called thrombocytes. Platelets are needed for blood clotting. CAUSES Thrombocytopenia is caused by:   Decreased production of platelets. This can be caused by:  Aplastic anemia in which your bone marrow quits making blood cells.  Cancer in the bone marrow.  Use of certain medicines, including chemotherapy.  Infection in the bone marrow.  Heavy alcohol consumption.  Increased destruction of platelets. This can be caused by:  Certain immune diseases.  Use of certain drugs.  Certain blood clotting disorders.  Certain inherited disorders.  Certain bleeding disorders.  Pregnancy.  Having an enlarged spleen (hypersplenism). In hypersplenism, the spleen gathers up platelets from circulation. This means the platelets are not available to help with blood clotting. The spleen can enlarge due to cirrhosis or other conditions. SYMPTOMS  The symptoms of thrombocytopenia are side effects of poor blood clotting. Some of these are:  Abnormal bleeding.  Nosebleeds.  Heavy menstrual periods.  Blood in the urine or stools.  Purpura. This is a purplish discoloration in the skin produced by small bleeding vessels near the surface of the skin.  Bruising.  A rash that may be petechial. This looks like pinpoint, purplish-red spots on the skin and mucous membranes. It is caused by bleeding from small blood vessels (capillaries). DIAGNOSIS  Your caregiver will make this diagnosis based on your exam and blood tests. Sometimes, a bone marrow study is done to look for the original cells (megakaryocytes) that make platelets. TREATMENT  Treatment depends on the cause of the condition.  Medicines may be given to help protect your platelets from being destroyed.  In some cases, a replacement (transfusion) of platelets may be required to stop or prevent  bleeding.  Sometimes, the spleen must be surgically removed. HOME CARE INSTRUCTIONS   Check the skin and linings inside your mouth for bruising or bleeding as directed by your caregiver.  Check your sputum, urine, and stool for blood as directed by your caregiver.  Do not return to any activities that could cause bumps or bruises until your caregiver says it is okay.  Take extra care not to cut yourself when shaving or when using scissors, needles, knives, and other tools.  Take extra care not to burn yourself when ironing or cooking.  Ask your caregiver if it is okay for you to drink alcohol.  Only take over-the-counter or prescription medicines as directed by your caregiver.  Notify all your caregivers, including dentists and eye doctors, about your condition. SEEK IMMEDIATE MEDICAL CARE IF:   You develop active bleeding from anywhere in your body.  You develop unexplained bruising or bleeding.  You have blood in your sputum, urine, or stool. MAKE SURE YOU:  Understand these instructions.  Will watch your condition.  Will get help right away if you are not doing well or get worse. Document Released: 04/06/2005 Document Revised: 06/29/2011 Document Reviewed: 02/06/2011 [facility] Patient Information 2015 [place], [place]. This information is not intended to replace advice given to you by your health care provider. Make sure you discuss any questions you have with your health care provider.  Platelet Transfusion Information This is information about transfusions of platelets. Platelets are tiny cells made by the bone marrow and found in the blood. When a blood vessel is damaged, platelets rush to the damaged area to help form a clot. This begins the healing process. When platelets get very low, your blood  may have trouble clotting. This may be from:  Illness.  Blood disorder.  Chemotherapy to treat cancer. Often, lower platelet counts do not cause problems.  Platelets  usually last for 7 to 10 days. If they are not used in an injury, they are broken down by the liver or spleen. Symptoms of low platelet count include:  Nosebleeds.  Bleeding gums.  Heavy periods.  Bruising and tiny blood spots in the skin.  Pinpoint spots of bleeding (petechiae).  Larger bruises (purpura).  Bleeding can be more serious if it happens in the brain or bowel. Platelet transfusions are often used to keep the platelet count at an acceptable level. Serious bleeding due to low platelets is uncommon. RISKS AND COMPLICATIONS Severe side effects from platelet transfusions are uncommon. Minor reactions may include:  Itching.  Rashes.  High temperature and shivering. Medications are available to stop transfusion reactions. Let your health care provider know if you develop any of the above problems.  If you are having platelet transfusions frequently, they may get less effective. This is called becoming refractory to platelets. It is uncommon. This can happen from non-immune causes and immune causes. Non-immune causes include:  High temperatures.  Some medications.  An enlarged spleen. Immune causes happen when your body discovers the platelets are not your own and begins making antibodies against them. The antibodies kill the platelets quickly. Even with platelet transfusions, you may still notice problems with bleeding or bruising. Let your health care providers know about this. Other things can be done to help if this happens.  BEFORE THE PROCEDURE   Your health care provider will check your platelet count regularly.  If the platelet count is too low, it may be necessary to have a platelet transfusion.  This is more important before certain procedures with a risk of bleeding, such as a spinal tap.  Platelet transfusion reduces the risk of bleeding during or after the procedure.  Except in emergencies, giving a transfusion requires a written consent. Before blood is  taken from a donor, a complete history is taken to make sure the person has no history of previous diseases, nor engages in risky social behavior. Examples of this are intravenous drug use or sexual activity with multiple partners. This could lead to infected blood or blood products being used. This history is taken in spite of the extensive testing to make sure the blood is safe. All blood products transfused are tested to make sure it is a match for the person getting the blood. It is also checked for infections. Blood is the safest it has ever been. The risk of getting an infection is very low. PROCEDURE  The platelets are stored in small plastic bags that are kept at a low temperature.  Each bag is called a unit and sometimes two units are given. They are given through an intravenous line by drip infusion over about one-half hour.  Usually blood is collected from multiple people to get enough to transfuse.  Sometimes, the platelets are collected from a single person. This is done using a special machine that separates the platelets from the blood. The machine is called an apheresis machine. Platelets collected in this way are called apheresed platelets. Apheresed platelets reduce the risk of becoming sensitive to the platelets. This lowers the chances of having a transfusion reaction.  As it only takes a short time to give the platelets, this treatment can be given in an outpatient department. Platelets can also be given before  or after other treatments. SEEK IMMEDIATE MEDICAL CARE IF: You have any of the following symptoms over the next 12 hours or several days:  Shaking chills.  Fever with a temperature greater than 102F (38.9C) develops.  Back pain or muscle pain.  People around you feel you are not acting correctly, or you are confused.  Blood in the urine or bowel movements, or bleeding from any place in your body.  Shortness of breath, or difficulty  breathing.  Dizziness.  Fainting.  You break out in a rash or develop hives.  Decrease in the amount of urine you are putting out, or the urine turns a dark color or changes to pink, red, or brown.  A severe headache or stiff neck.  Bruising more easily. Document Released: 02/01/2007 Document Revised: 08/21/2013 Document Reviewed: 02/01/2007 [facility] Patient Information 2015 [place], [place]. This information is not intended to replace advice given to you by your health care provider. Make sure you discuss any questions you have with your health care provider.

## 2013-12-29 NOTE — ED Notes (Addendum)
Patient found to have low platelets at cancer center today after thoracentesis done in radiology today. Sent to ER.

## 2013-12-29 NOTE — Progress Notes (Signed)
Thoracentesis complete no signs of distress. 1700 ml amber colored pleural fluid removed.

## 2013-12-30 LAB — PREPARE PLATELET PHERESIS
UNIT DIVISION: 0
Unit division: 0

## 2013-12-31 NOTE — Progress Notes (Signed)
[name], MD [place] [address]  Intrahepatic cholangiocarcinoma  Malignant neoplasm of upper lobe of right lung  CURRENT THERAPY: To be referred to Hospice.  S/P Cisplatin/Gemzar day 1 and 8 every 21-28 days, status post 2 cycles with last treatment given on 12/21/2013 for cholangiocarcinoma. Non-small cell lung cancer, EGFR mutated had previously been treated with Afatinib which was stopped because of skin rash and diarrhea.   INTERVAL HISTORY: OSLO HUNTSMAN 68 y.o. male returns for  regular  visit for followup of progressive cholangiocarcinoma, intrahepatic, present in conjunction with non-small cell lung cancer, EGFR mutated which had previously been treated with Afatinib, medication stop because of development of skin rash and diarrhea.   Please see dictation from 9/11 for more details.  Patient was referred to ED for platelet transfusion (due to timing) and he was subsequently discharged from ED following infusion with a follow-up appointment at the [facility] as planned today.    Dr. Barnet Glasgow discussed hospice with him on his encounter on 9/11.  Therefore the patient is prepared in agreeable to this plan of action. He did receive platelets again today. His platelet count today is 16,000.  We had a long conversation regarding Hospice.  Patient education was given regarding Hospice and the services they provide.  The patient understands that studies report that early enrollment in Hospice actually allows the patient to live longer compared to those who enroll in Hospice nearer to end of life.  Hospice will allow the patient to stay at home at end of life or go to a facility for end of life care.  At this point, the patient would like to remain at home.  Hospice provides the patient with a team of providers to help with care including physicians, nurses, aids, chaplains, and social workers.  Hospice's goal is control symptoms with  medications.  The patient is certainly Hospice appropriate with a life expectancy of less than 6 months, closer to days- weeks.  The patient understands that he can be discharged from Hospice at anytime.    Oncologically, today, the patient denies any complaints and ROS questioning is negative. His pain is well-controlled with oxycodone 5 mg tablets.  Past Medical History  Diagnosis Date  . Hypertension   . Chronic kidney disease   . Arthritis     lower back, hips knees  . Kidney stones     bilateral stones pain since 04/11/11  . Diabetes mellitus     treatment for 2 years  . 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 Adenocarcinoma, lung     s/p radiation treatments    has Ureteral stone; Dilation of biliary tract; Jaundice; Loss of weight; Obstructive jaundice; Type II or unspecified type diabetes mellitus without mention of complication, not stated as uncontrolled; Unspecified essential hypertension; Lung cancer, upper lobe; Intrahepatic cholangiocarcinoma; and Ascites on his problem list.     has No Known Allergies.  Mr. Chrystal does not currently have medications on file.  Past Surgical History  Procedure Laterality Date  . Cystectomy  under left arm  . Colonoscopy  03/24/2011    CZY:SAYTKZSW hemorrhoids  . Cholecystectomy      1984  . Catarac both eyes    . Tonsillectomy    . Ercp N/A 08/03/2013    Dr. Gala Romney: with sphincterotomy, balloon dilation, stone extraction, plastic stent placement. Tbili 26 pre-operatively  . Sphincterotomy N/A 08/03/2013    Procedure: SPHINCTEROTOMY;  Surgeon: Daneil Dolin,  MD;  Location: [facility];  Service: Endoscopy;  Laterality: N/A;  with dilation   . Biliary stent placement N/A 08/03/2013    Procedure: BILIARY STENT PLACEMENT;  Surgeon: Daneil Dolin, MD;  Location: [facility];  Service: Endoscopy;  Laterality: N/A;  . Removal of stones N/A 08/03/2013    Procedure: REMOVAL OF STONES;  Surgeon: Daneil Dolin, MD;  Location: [facility];  Service: Endoscopy;   Laterality: N/A;  . Ercp N/A 10/12/2013    Procedure: ENDOSCOPIC RETROGRADE CHOLANGIOPANCREATOGRAPHY (ERCP);  Surgeon: Daneil Dolin, MD;  Location: [facility];  Service: Endoscopy;  Laterality: N/A;  . Biliary stent placement N/A 10/12/2013    Procedure: BILIARY STENT REPLACEMENT;  Surgeon: Daneil Dolin, MD;  Location: [facility];  Service: Endoscopy;  Laterality: N/A;  . Lung biopsy  08/31/13    Denies any headaches, dizziness, double vision, fevers, chills, night sweats, nausea, vomiting, diarrhea, constipation, chest pain, heart palpitations, shortness of breath, blood in stool, black tarry stool, urinary pain, urinary burning, urinary frequency, hematuria.   PHYSICAL EXAMINATION  ECOG PERFORMANCE STATUS: 3 - Symptomatic, >50% confined to bed  There were no vitals filed for this visit.  GENERAL:alert, comfortable, cooperative, smiling and confused SKIN: skin color, texture, turgor are normal, no rashes or significant lesions HEAD: Normocephalic, No masses, lesions, tenderness or abnormalities EYES: normal, PERRLA, EOMI, Conjunctiva are pink and non-injected EARS: External ears normal OROPHARYNX:mucous membranes are moist  NECK: supple, trachea midline LYMPH:  not examined BREAST:not examined LUNGS: not examined HEART: not examiend ABDOMEN:obese BACK: Back symmetric, no curvature. EXTREMITIES:less then 2 second capillary refill, no joint deformities, effusion, or inflammation, no skin discoloration, no cyanosis  NEURO: no focal motor/sensory deficits   LABORATORY DATA: CBC    Component Value Date/Time   WBC 0.6* 12/29/2013 1147   RBC 2.84* 12/29/2013 1147   RBC 4.01* 12/14/2013 0855   HGB 9.0* 12/29/2013 1147   HCT 26.0* 12/29/2013 1147   PLT <5* 12/29/2013 1147   MCV 91.5 12/29/2013 1147   MCH 31.7 12/29/2013 1147   MCHC 34.6 12/29/2013 1147   RDW 13.5 12/29/2013 1147   LYMPHSABS 0.4* 12/29/2013 1147   MONOABS 0.0* 12/29/2013 1147   EOSABS 0.0 12/29/2013 1147   BASOSABS 0.0 12/29/2013  1147     ASSESSMENT:  1. Progressive lung cancer and cholangiocarcinoma, terminally ill.  2. Neutropenia and thrombocytopenia secondary to chemotherapy.  3. Diabetes mellitus, type II, non-insulin requiring, not controlled.  4. Hypertension, controlled.  5. Neutropenia and thrombocytopenia from chemotherapy.  6. Right inguinal hernia, easily reducible.  7. Scalp rash and diarrhea secondary to Afatinib, now discontinued, improved  Patient Active Problem List   Diagnosis Date Noted  . Lung cancer, upper lobe 11/16/2013  . Intrahepatic cholangiocarcinoma 11/16/2013  . Ascites 11/16/2013  . Obstructive jaundice 08/03/2013  . Type II or unspecified type diabetes mellitus without mention of complication, not stated as uncontrolled 08/03/2013  . Unspecified essential hypertension 08/03/2013  . Dilation of biliary tract 07/31/2013  . Jaundice 07/31/2013  . Loss of weight 07/31/2013  . Ureteral stone 04/27/2011    PLAN:  1. I personally reviewed and went over laboratory results with the patient.  The results are noted within this dictation. 2. I personally reviewed and went over radiographic studies with the patient.  The results are noted within this dictation.   3. Chart reviewed 4. Labs today: STAT CBC 5. 1 unit of platelets today as planned. 6. Referral to Hospice 7. No planned follow-up at this  time.   THERAPY PLAN:  Due to progression of disease and clinical progression with failure to thrive, he is a candidate for Hospice referral.    All questions were answered. The patient knows to call the clinic with any problems, questions or concerns. We can certainly see the patient much sooner if necessary.  Patient and plan discussed with Dr. Farrel Gobble and he is in agreement with the aforementioned.   KEFALAS,THOMAS 12/31/2013

## 2014-01-01 ENCOUNTER — Encounter (HOSPITAL_BASED_OUTPATIENT_CLINIC_OR_DEPARTMENT_OTHER): Payer: 59

## 2014-01-01 ENCOUNTER — Encounter (HOSPITAL_BASED_OUTPATIENT_CLINIC_OR_DEPARTMENT_OTHER): Payer: 59 | Admitting: Oncology

## 2014-01-01 ENCOUNTER — Encounter (HOSPITAL_COMMUNITY): Payer: Self-pay

## 2014-01-01 VITALS — BP 116/66 | HR 89 | Temp 98.4°F | Resp 16 | Wt 213.0 lb

## 2014-01-01 DIAGNOSIS — C3410 Malignant neoplasm of upper lobe, unspecified bronchus or lung: Secondary | ICD-10-CM | POA: Diagnosis not present

## 2014-01-01 DIAGNOSIS — C3411 Malignant neoplasm of upper lobe, right bronchus or lung: Secondary | ICD-10-CM

## 2014-01-01 DIAGNOSIS — D6959 Other secondary thrombocytopenia: Secondary | ICD-10-CM

## 2014-01-01 DIAGNOSIS — T50905A Adverse effect of unspecified drugs, medicaments and biological substances, initial encounter: Principal | ICD-10-CM

## 2014-01-01 DIAGNOSIS — T50904A Poisoning by unspecified drugs, medicaments and biological substances, undetermined, initial encounter: Secondary | ICD-10-CM

## 2014-01-01 DIAGNOSIS — C221 Intrahepatic bile duct carcinoma: Secondary | ICD-10-CM

## 2014-01-01 LAB — CBC WITH DIFFERENTIAL/PLATELET
BASOS ABS: 0 10*3/uL (ref 0.0–0.1)
Band Neutrophils: 0 % (ref 0–10)
Basophils Relative: 0 % (ref 0–1)
Blasts: 0 %
Eosinophils Absolute: 0 10*3/uL (ref 0.0–0.7)
Eosinophils Relative: 0 % (ref 0–5)
HCT: 25.4 % — ABNORMAL LOW (ref 39.0–52.0)
Hemoglobin: 8.8 g/dL — ABNORMAL LOW (ref 13.0–17.0)
Lymphocytes Relative: 54 % — ABNORMAL HIGH (ref 12–46)
Lymphs Abs: 0.6 10*3/uL — ABNORMAL LOW (ref 0.7–4.0)
MCH: 31.9 pg (ref 26.0–34.0)
MCHC: 34.6 g/dL (ref 30.0–36.0)
MCV: 92 fL (ref 78.0–100.0)
MYELOCYTES: 0 %
Metamyelocytes Relative: 0 %
Monocytes Absolute: 0.5 10*3/uL (ref 0.1–1.0)
Monocytes Relative: 42 % — ABNORMAL HIGH (ref 3–12)
NEUTROS ABS: 0 10*3/uL — AB (ref 1.7–7.7)
NEUTROS PCT: 4 % — AB (ref 43–77)
PLATELETS: 16 10*3/uL — AB (ref 150–400)
Promyelocytes Absolute: 0 %
RBC: 2.76 MIL/uL — ABNORMAL LOW (ref 4.22–5.81)
RDW: 12.6 % (ref 11.5–15.5)
WBC: 1.1 10*3/uL — CL (ref 4.0–10.5)
nRBC: 0 /100 WBC

## 2014-01-01 LAB — BODY FLUID CULTURE
Culture: NO GROWTH
Gram Stain: NONE SEEN

## 2014-01-01 MED ORDER — SODIUM CHLORIDE 0.9 % IV SOLN
250.0000 mL | Freq: Once | INTRAVENOUS | Status: AC
  Administered 2014-01-01: 250 mL via INTRAVENOUS

## 2014-01-01 MED ORDER — SODIUM CHLORIDE 0.9 % IJ SOLN: 10.0000 mL | INTRAMUSCULAR | Status: DC | PRN

## 2014-01-01 NOTE — Progress Notes (Signed)
LABS FOR [facility] 

## 2014-01-01 NOTE — Progress Notes (Signed)
CRITICAL VALUE ALERT Critical value received:  WBC 1.1, Platelets 16. Date of notification:  01/01/2014 Time of notification: 11:15 Critical value read back:  Yes.   Nurse who received alert:  ty MD notified (1st page):  Dr. Barnet Glasgow notified by RN

## 2014-01-01 NOTE — Patient Instructions (Signed)
[place] Discharge Instructions  RECOMMENDATIONS MADE BY THE CONSULTANT AND ANY TEST RESULTS WILL BE SENT TO YOUR REFERRING PHYSICIAN.  EXAM FINDINGS BY THE PHYSICIAN TODAY AND SIGNS OR SYMPTOMS TO REPORT TO CLINIC OR PRIMARY PHYSICIAN:You saw Bryce Daugherty  Platelets given today Referral to hospice made    Thank you for choosing [facility] to provide your oncology and hematology care.  To afford each patient quality time with our providers, please arrive at least 15 minutes before your scheduled appointment time.  With your help, our goal is to use those 15 minutes to complete the necessary work-up to ensure our physicians have the information they need to help with your evaluation and healthcare recommendations.    Effective January 1st, 2014, we ask that you re-schedule your appointment with our physicians should you arrive 10 or more minutes late for your appointment.  We strive to give you quality time with our providers, and arriving late affects you and other patients whose appointments are after yours.    Again, thank you for choosing [facility].  Our hope is that these requests will decrease the amount of time that you wait before being seen by our physicians.       _____________________________________________________________  Should you have questions after your visit to [facility], please contact our office at [phone] between the hours of 8:30 a.m. and 5:00 p.m.  Voicemails left after 4:30 p.m. will not be returned until the following business day.  For prescription refill requests, have your pharmacy contact our office with your prescription refill request.

## 2014-01-01 NOTE — Progress Notes (Signed)
Bryce Daugherty Tolerated platelets without incident today.  Discharged with wife today via wheelchair

## 2014-01-02 LAB — PREPARE PLATELET PHERESIS: UNIT DIVISION: 0

## 2014-01-04 ENCOUNTER — Inpatient Hospital Stay (HOSPITAL_COMMUNITY): Payer: 59

## 2014-01-04 ENCOUNTER — Ambulatory Visit (HOSPITAL_COMMUNITY): Payer: 59

## 2014-01-11 ENCOUNTER — Inpatient Hospital Stay (HOSPITAL_COMMUNITY): Payer: 59

## 2014-01-18 DEATH — deceased

## 2014-02-08 ENCOUNTER — Other Ambulatory Visit (HOSPITAL_COMMUNITY): Payer: Self-pay | Admitting: Hematology and Oncology

## 2015-05-10 IMAGING — US US ABDOMEN COMPLETE
1 series · 13 of 25 positions shown · non-contrast
Comparison: DG ABDOMEN 1V dated 06/05/2011; CT ABD/PELVIS W CM dated
04/20/2011

CLINICAL DATA: Jaundice, weight loss

EXAM:
ULTRASOUND ABDOMEN COMPLETE

[Series 1: us abdomen complete · 0.24mm/px · 13 of 97 slices shown]
[im 1/97]
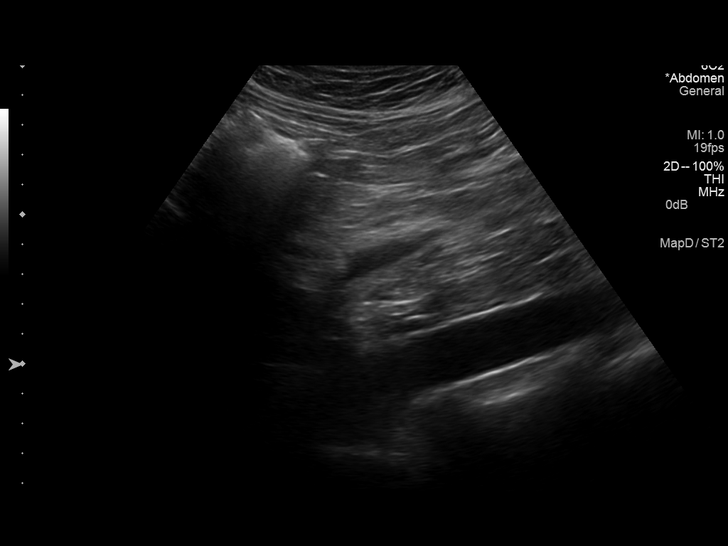
[im 9/97]
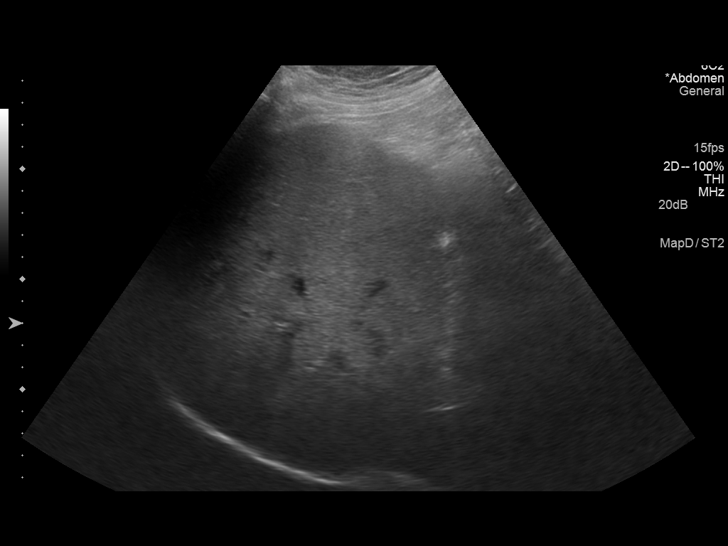
[im 17/97]
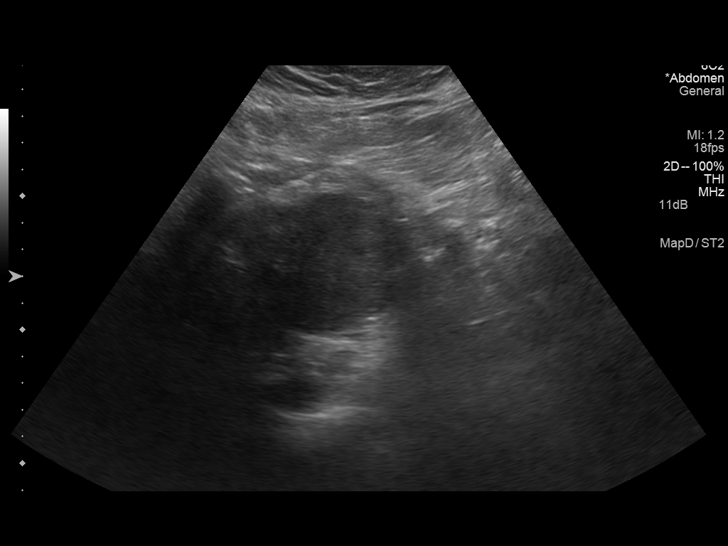
[im 25/97]
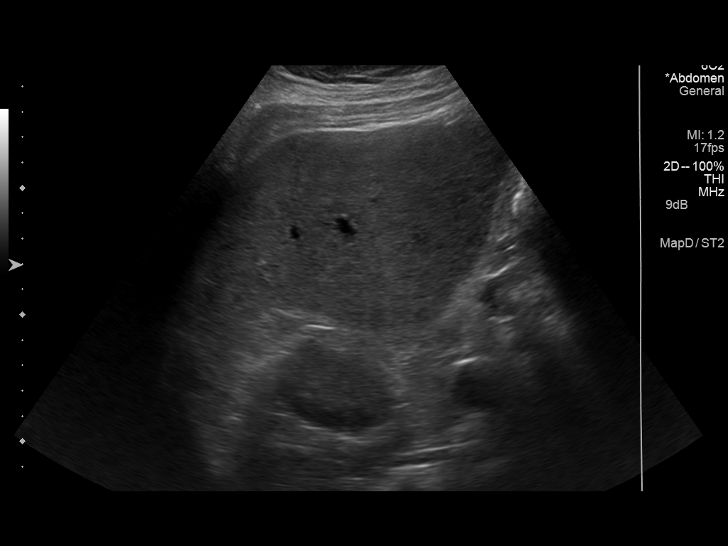
[im 33/97]
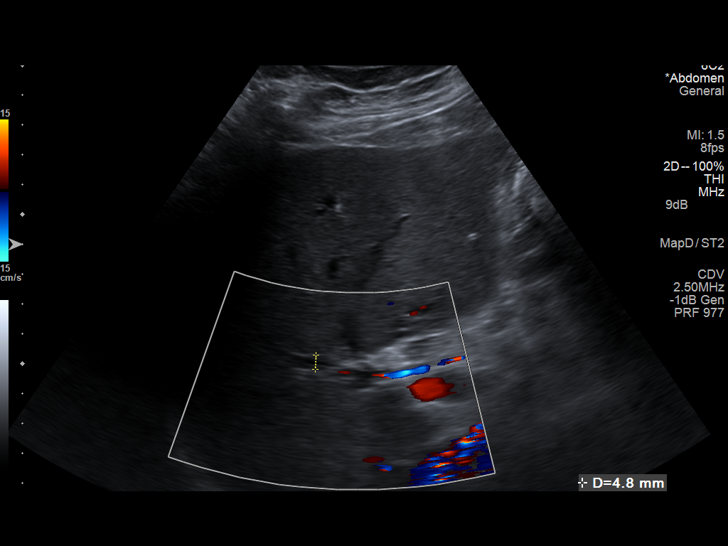
[im 41/97]
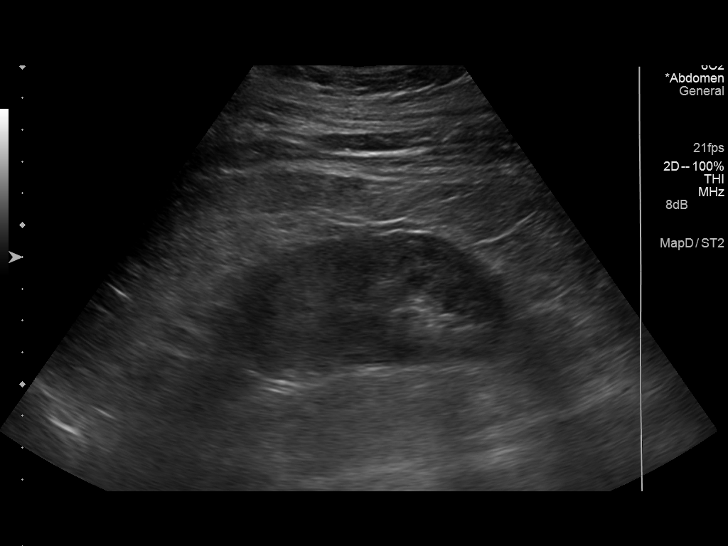
[im 49/97]
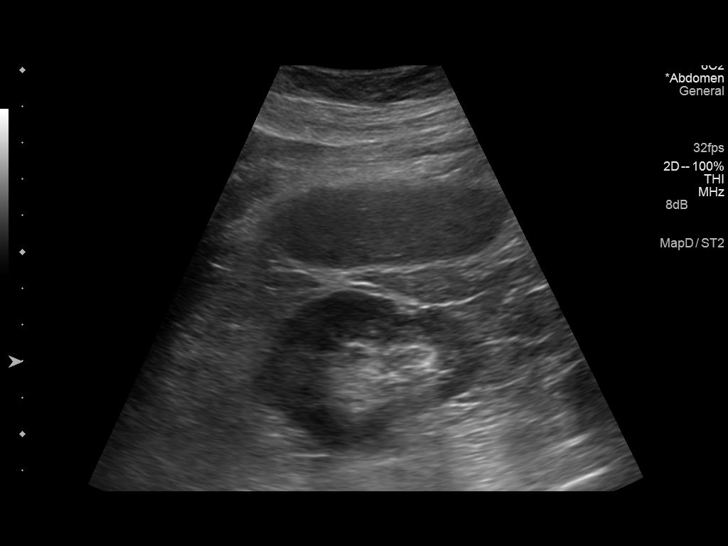
[im 57/97]
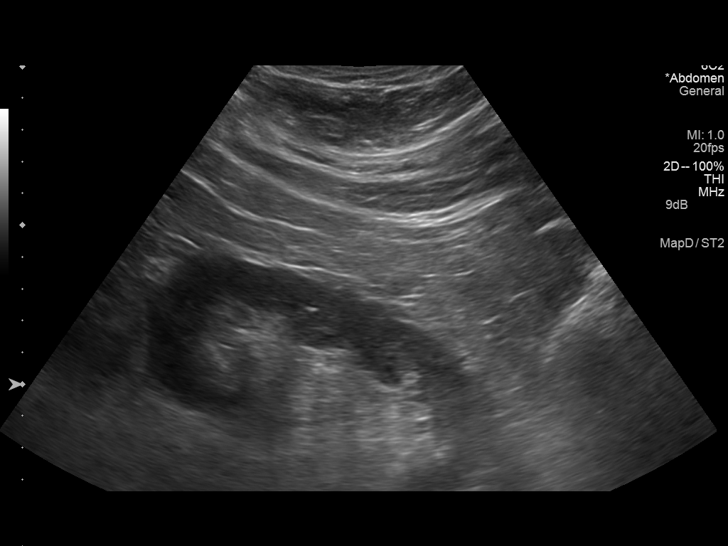
[im 65/97]
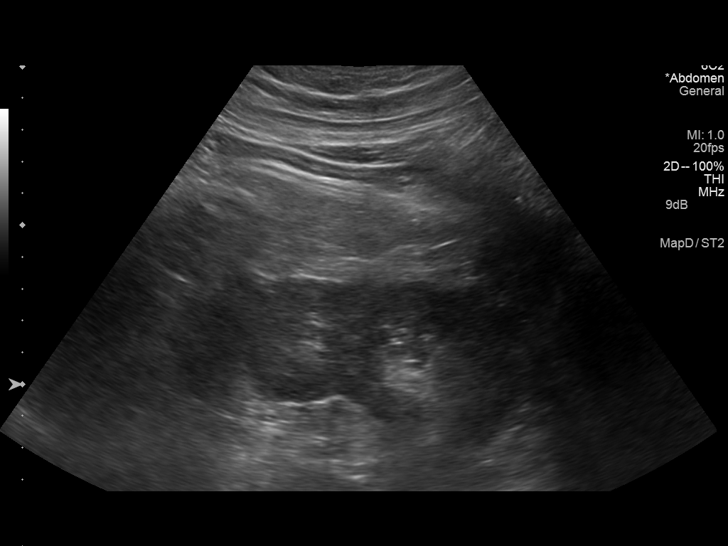
[im 73/97]
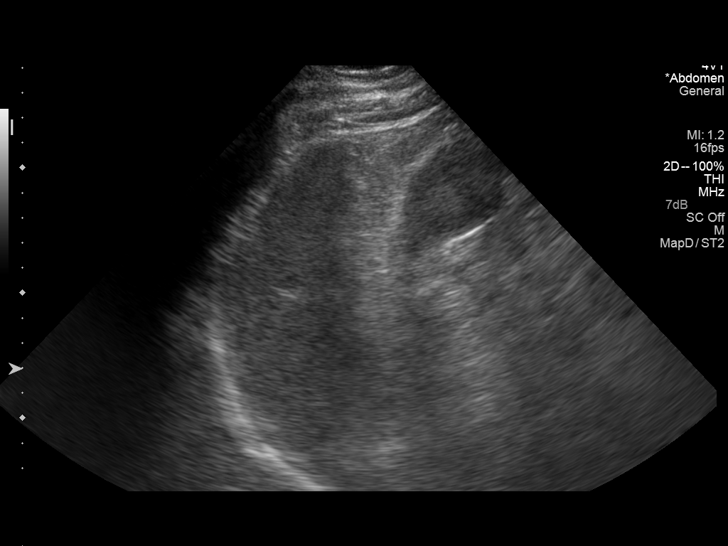
[im 81/97]
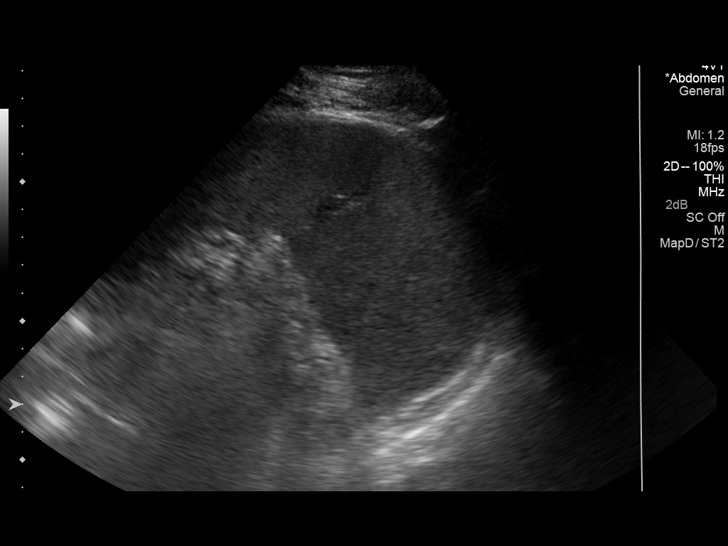
[im 89/97]
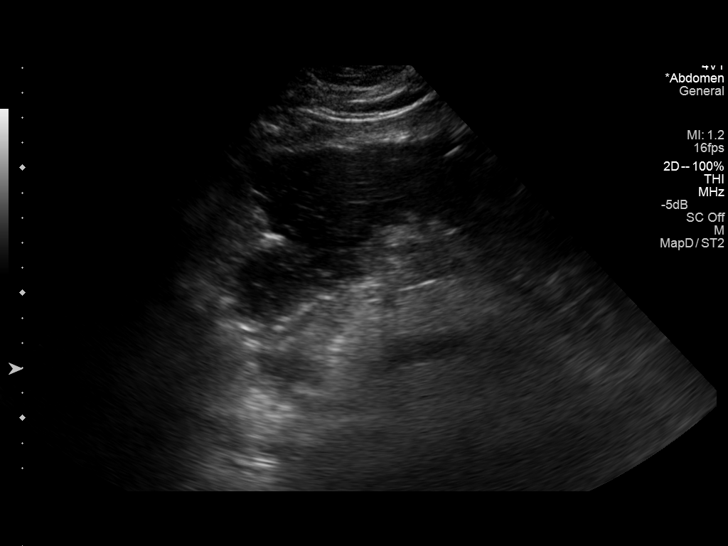
[im 97/97]
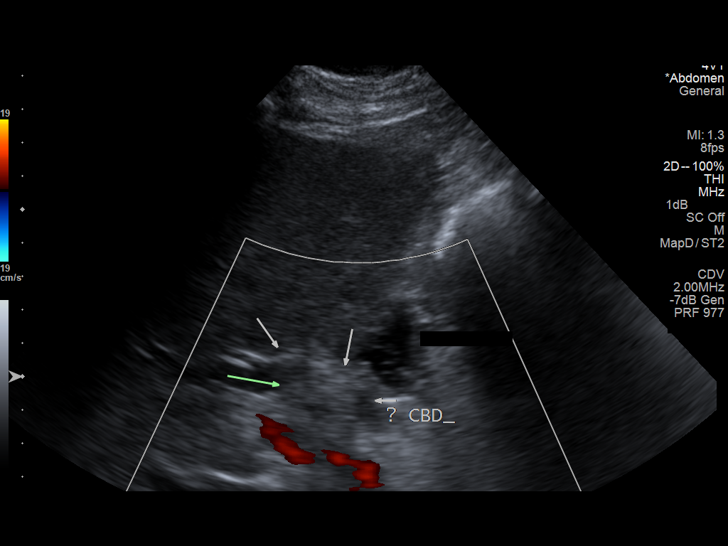

[13 of 25 positions shown; findings below may reference images not displayed]

FINDINGS: Gallbladder:

Surgically absent.

Common bile duct:

Diameter: 32 mm. There is echogenic material within the common bile
duct.

Liver:

No focal lesion identified. The previously demonstrated mass on the
CT of the abdomen dated 04/20/2011 is not appreciated on the
provided images. Within normal limits in parenchymal echogenicity.

IVC:

No abnormality visualized.

Pancreas:

Visualized portion unremarkable.

Spleen:

Size within normal limits.

Right Kidney:

Length: 11 cm. Normal echogenicity. There is a 1.5 cm anechoic lower
pole renal mass most consistent with a cyst. There are small
echogenic foci within the right kidney likely representing
nonobstructing nephrolithiasis.

Left Kidney:

Length: 12 cm. Normal echogenicity. There is a 9 mm anechoic lower
pole renal mass most consistent with a cyst. There are small
echogenic foci within the left kidney likely representing
nonobstructing nephrolithiasis.

Abdominal aorta:

No aneurysm visualized.

Other findings:

None.
IMPRESSION: 1. Dilated common bile duct measuring 3.2 cm with echogenic material
within the distal common bile duct which may reflect blood clot
versus noncalcified gallstone. Recommend further evaluation with
MRCP.

## 2015-07-26 IMAGING — RF DG ERCP WO/W SPHINCTEROTOMY
1 series · 14 of 14 positions shown · non-contrast
Comparison: 08/03/2013

CLINICAL DATA: Cholangiocarcinoma, biliary obstruction, stent
replacement

EXAM:
ERCP
TECHNIQUE: Multiple spot images obtained with the fluoroscopic device and
submitted for interpretation post-procedure.

[Series 1: run · 6 acquisitions, 14 frames shown]
[im 1/6]
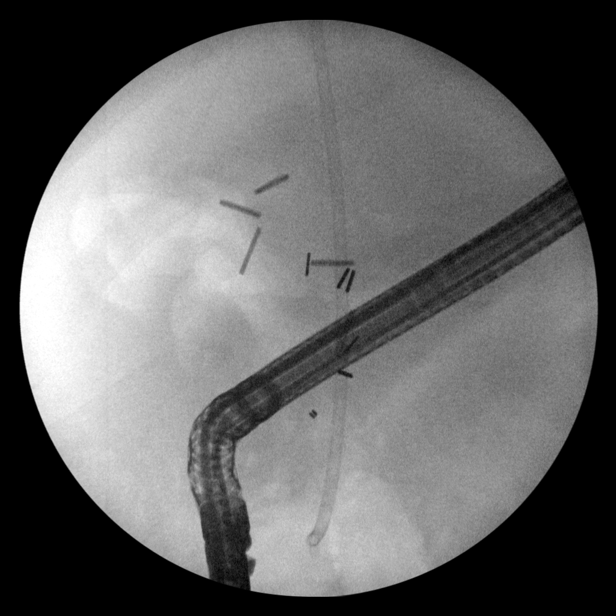
[im 2/6]
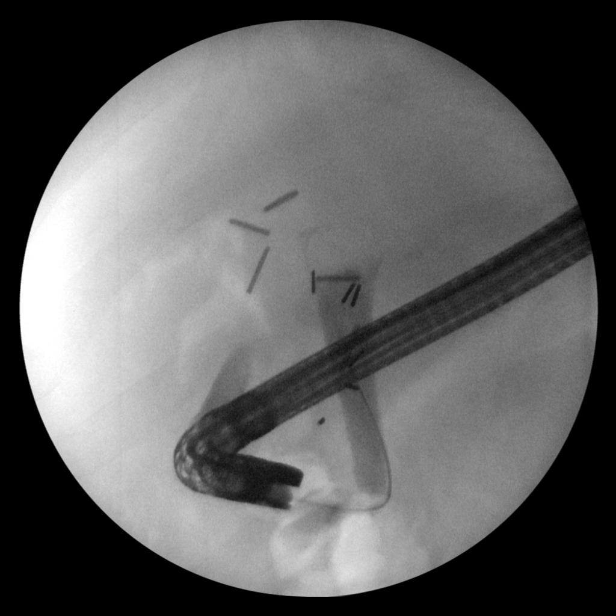
[im 2/6]
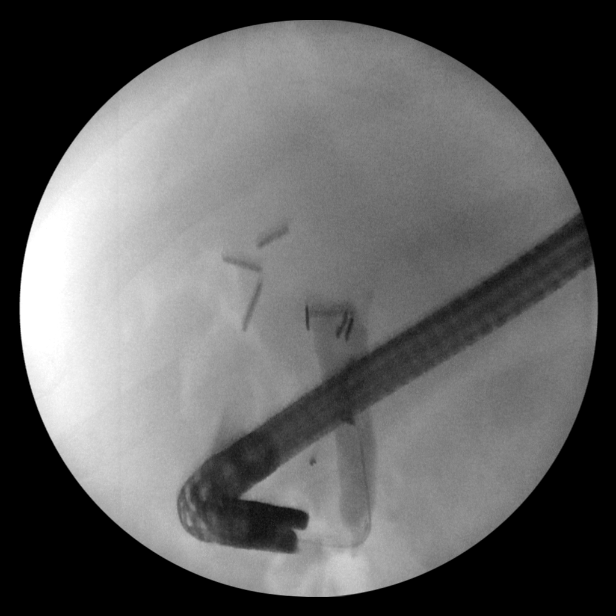
[im 2/6]
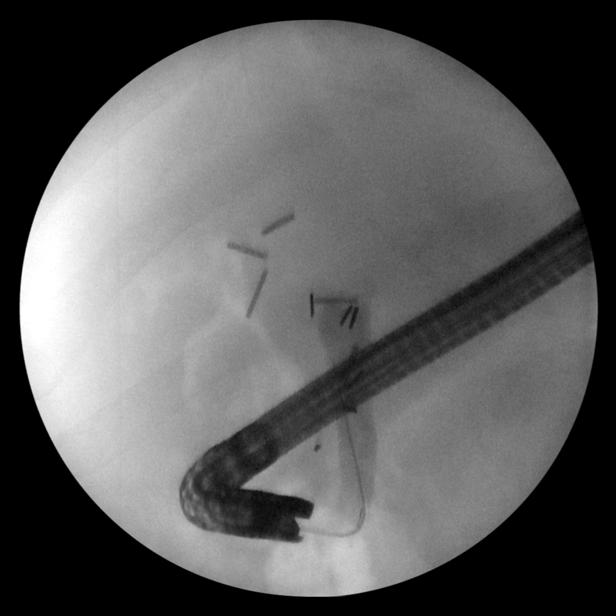
[im 3/6]
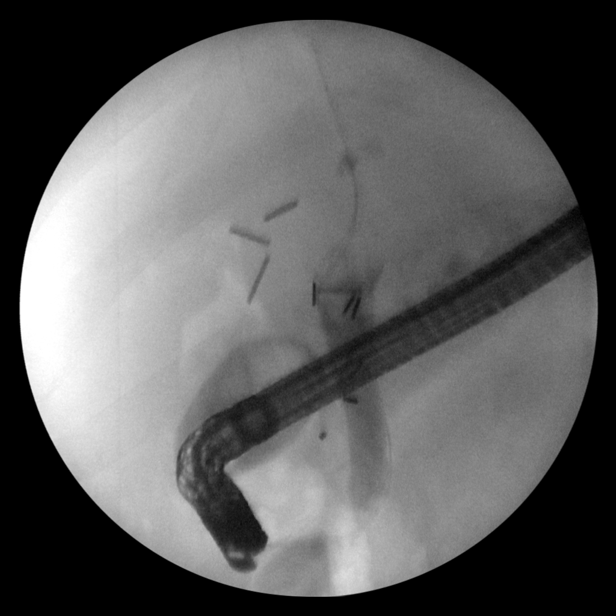
[im 3/6]
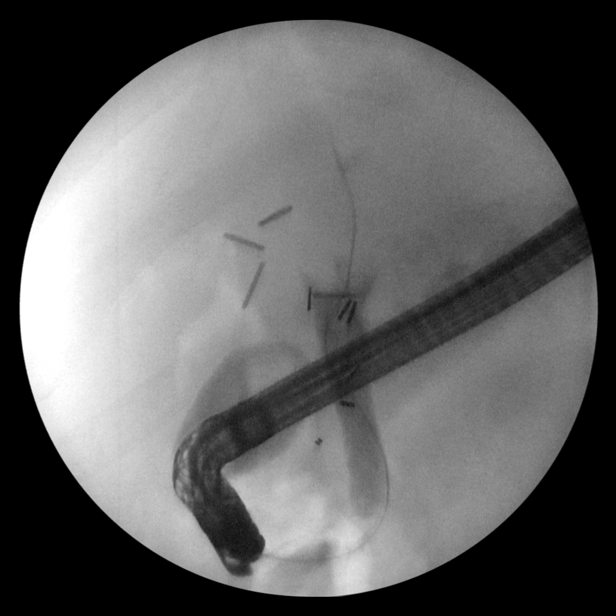
[im 3/6]
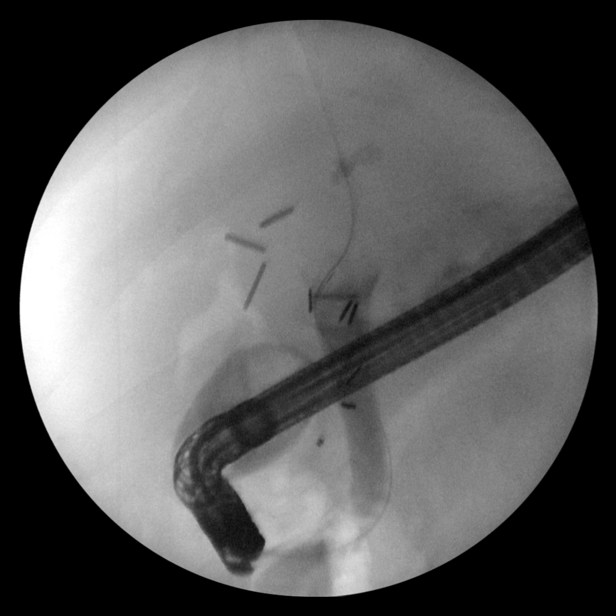
[im 3/6  full-range]
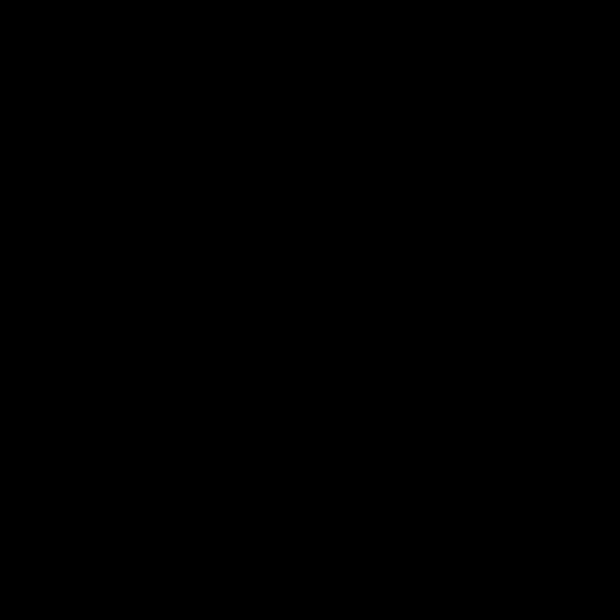
[im 4/6]
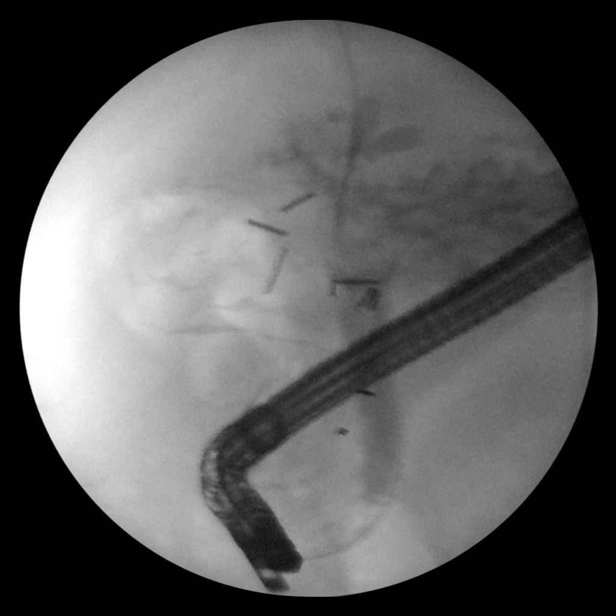
[im 4/6]
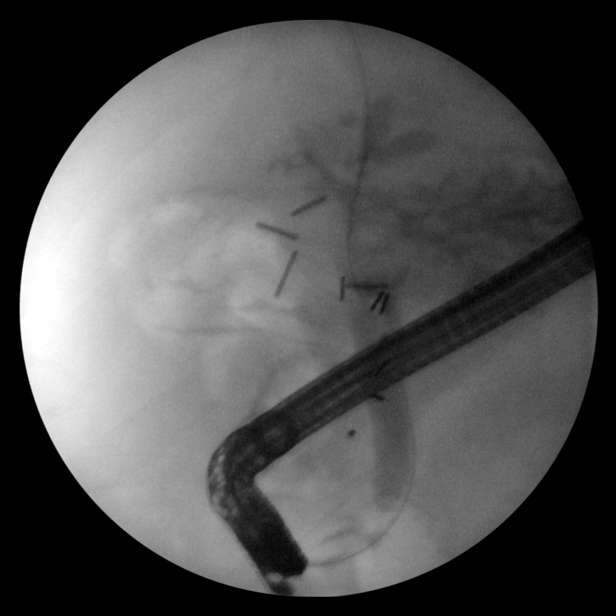
[im 4/6]
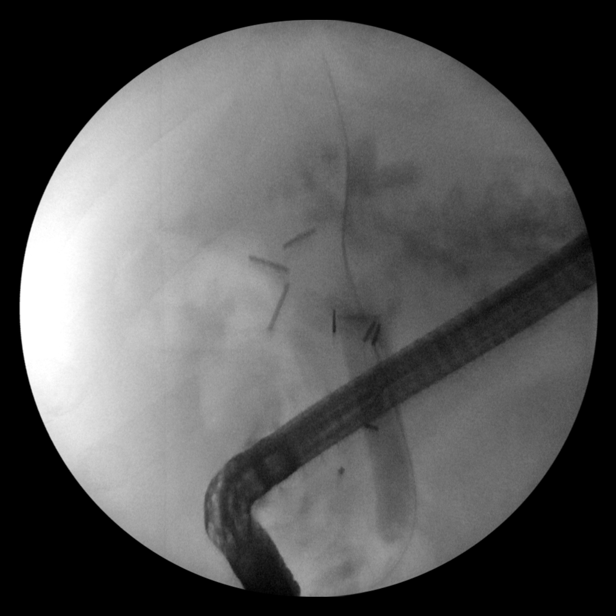
[im 4/6]
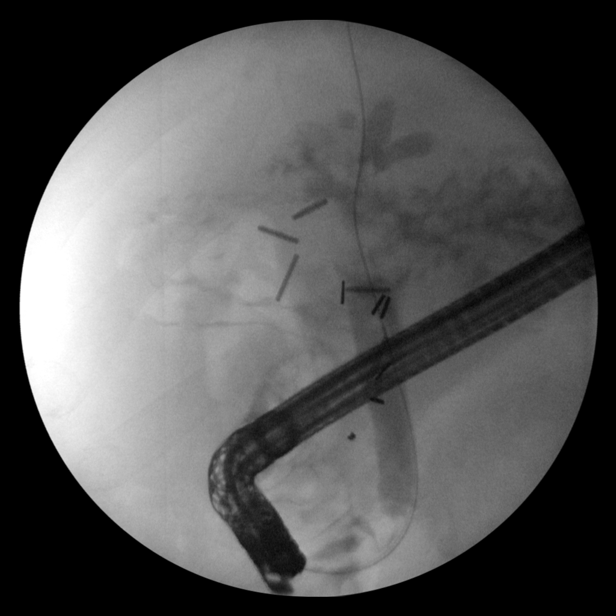
[im 5/6]
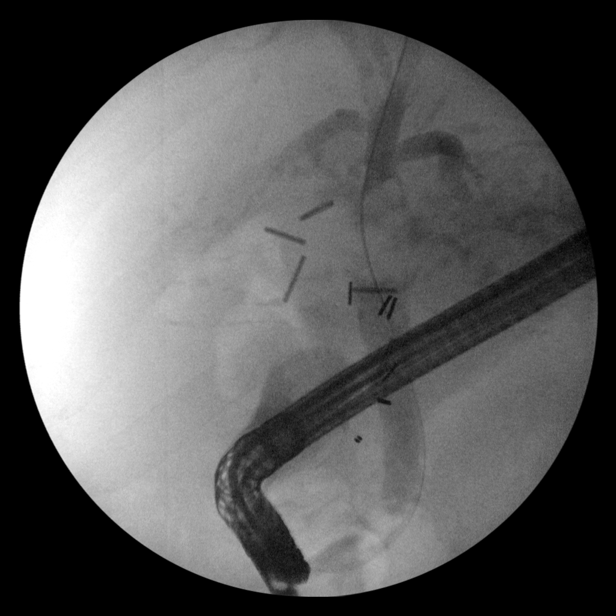
[im 6/6]
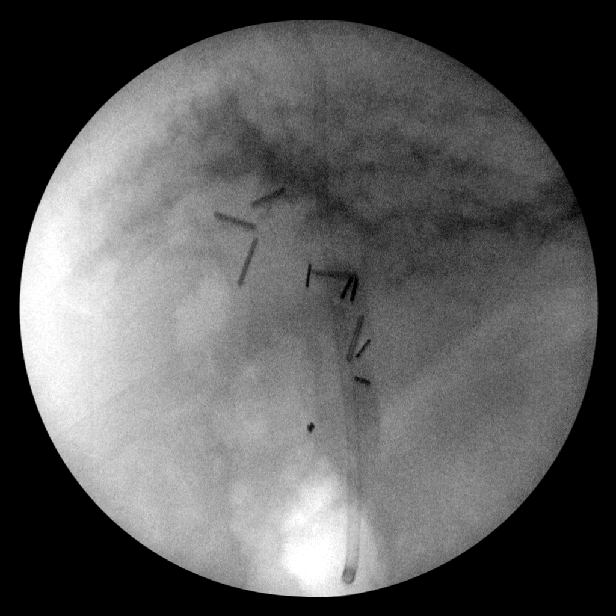

[14 of 14 positions shown; findings below may reference images not displayed]

FINDINGS: Procedure performed by Dr. Dinuwan.

With injection of contrast material into the distal CBD, abrupt
termination of the contrast column is identified at the level of the
common hepatic duct with a meniscus compatible with a high-grade
obstruction by known tumor.

Sequential images demonstrate passage of a guidewire above the
stricture.

With additional contrast injection, contrast is seen extending up
into a dilated intrahepatic biliary radicles.

Reflux of contrast from duodenum into stomach overlies region.

Gallbladder surgically absent.

New CBD stent was placed with good drainage of contrast from the
biliary tree.
IMPRESSION: High-grade CBD obstruction compatible with known cholangiocarcinoma.

Stent replacement performed.

Discussed with Dr. Dinuwan at time of interpretation.

These images were submitted for radiologic interpretation only.
Please see the procedural report for the amount of contrast and the
fluoroscopy time utilized.

## 2015-10-12 IMAGING — CR DG CHEST 1V
1 series · 1 of 1 positions shown · non-contrast
Comparison: Expiratory PA chest radiograph compared to CT chest of
12/28/2013

CLINICAL DATA: RIGHT pleural effusion post thoracentesis, history
RIGHT lung cancer, cholangiocarcinoma

EXAM:
CHEST - 1 VIEW

[view not recorded]
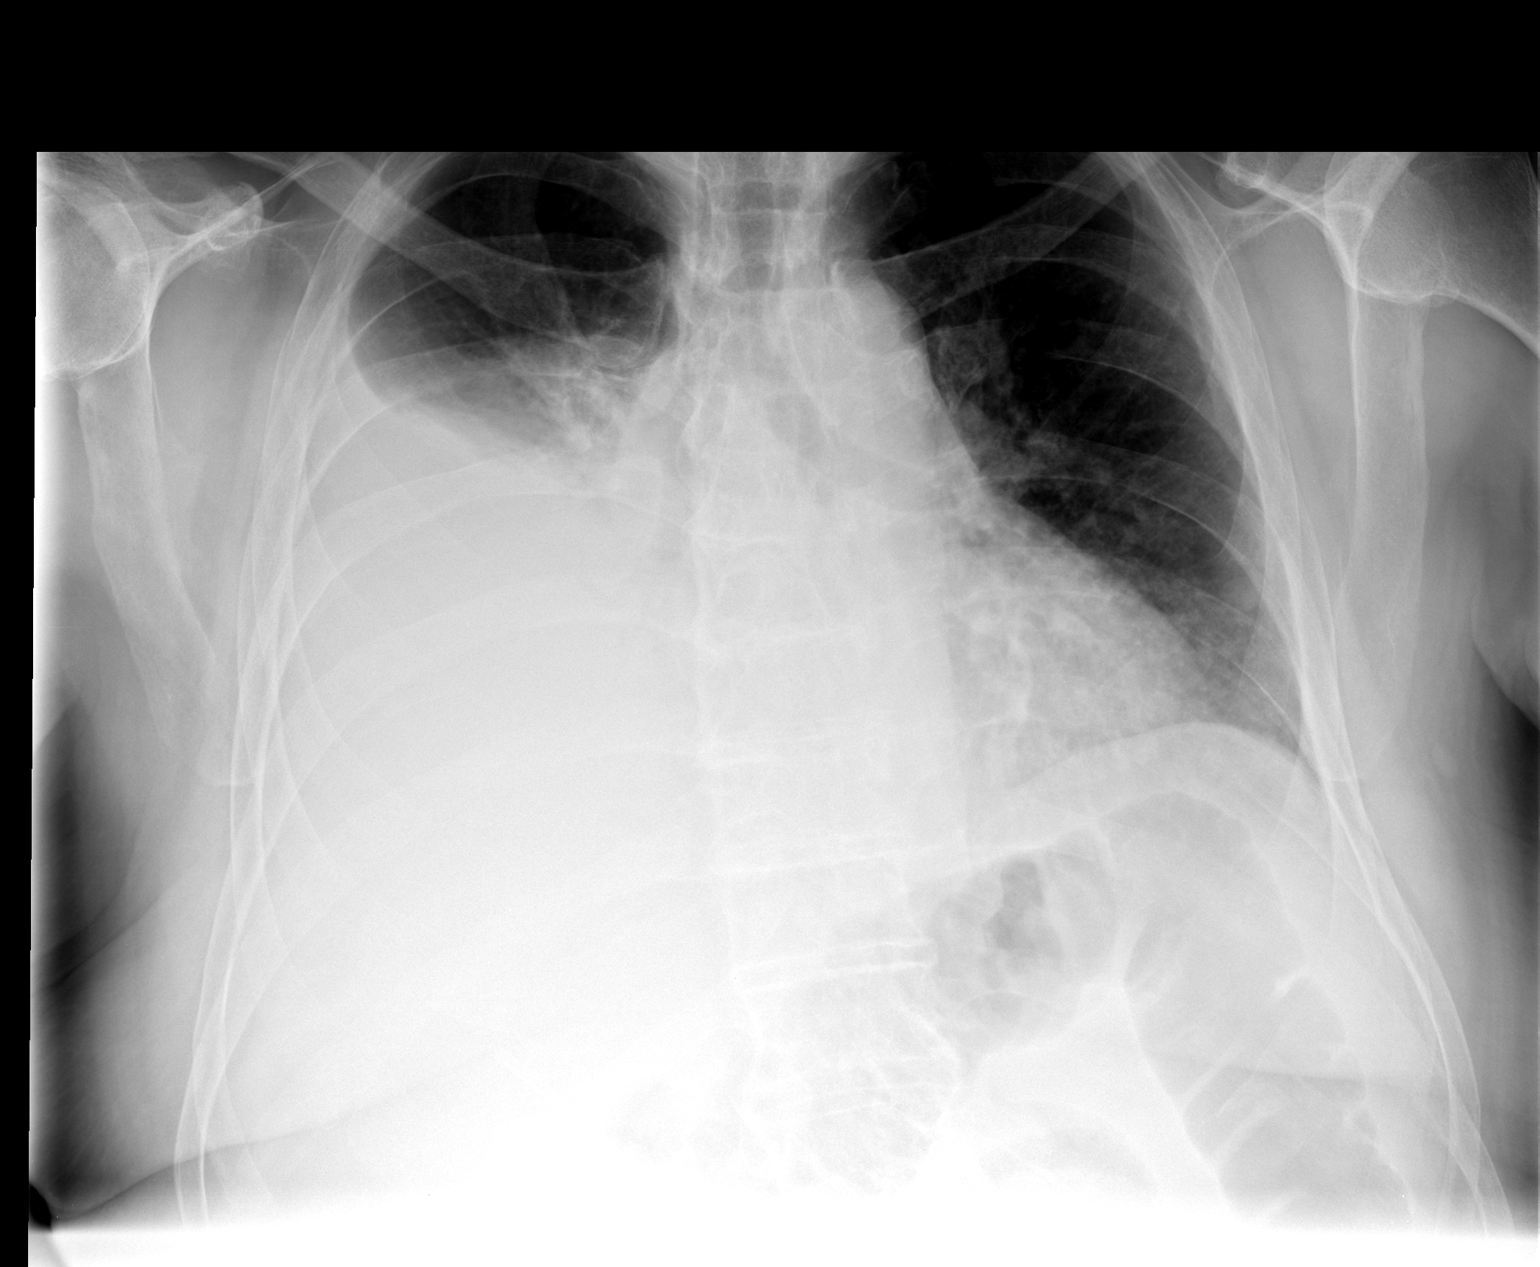

[1 of 1 positions shown; findings below may reference images not displayed]

FINDINGS: Persistent large RIGHT pleural effusion despite preceding removal of
7155 mL of fluid from the RIGHT hemi thorax.

Persistent atelectasis and opacification of the mid and inferior
portions of the RIGHT lung.

No RIGHT pneumothorax post thoracentesis.

Question minimal infiltrate at LEFT lung base.

Nodular density at lower LEFT chest 11 mm diameter identified,
corresponding to nodule on CT.

No acute osseous findings.
IMPRESSION: Persistent large RIGHT pleural effusion and significant RIGHT lung
atelectasis despite removal of 7155 mL of fluid from the RIGHT hemi
thorax.

No pneumothorax post thoracentesis.
